# Patient Record
Sex: Male | Born: 1955 | Race: White | Hispanic: No | Marital: Single | Smoking: Current every day smoker
Health system: Southern US, Community
[De-identification: ages and names within clinical notes are randomized; demographics above are authoritative.]

## PROBLEM LIST (undated history)

## (undated) DIAGNOSIS — F32A Depression, unspecified: Secondary | ICD-10-CM

## (undated) DIAGNOSIS — J439 Emphysema, unspecified: Secondary | ICD-10-CM

## (undated) DIAGNOSIS — F329 Major depressive disorder, single episode, unspecified: Secondary | ICD-10-CM

## (undated) DIAGNOSIS — I639 Cerebral infarction, unspecified: Secondary | ICD-10-CM

## (undated) DIAGNOSIS — K5792 Diverticulitis of intestine, part unspecified, without perforation or abscess without bleeding: Secondary | ICD-10-CM

## (undated) DIAGNOSIS — I714 Abdominal aortic aneurysm, without rupture: Secondary | ICD-10-CM

## (undated) DIAGNOSIS — R569 Unspecified convulsions: Secondary | ICD-10-CM

## (undated) DIAGNOSIS — G4700 Insomnia, unspecified: Secondary | ICD-10-CM

## (undated) DIAGNOSIS — A6000 Herpesviral infection of urogenital system, unspecified: Secondary | ICD-10-CM

## (undated) HISTORY — PX: JOINT REPLACEMENT: SHX530

## (undated) HISTORY — DX: Depression, unspecified: F32.A

## (undated) HISTORY — DX: Emphysema, unspecified: J43.9

## (undated) HISTORY — DX: Major depressive disorder, single episode, unspecified: F32.9

## (undated) HISTORY — PX: BUNIONECTOMY: SHX129

## (undated) HISTORY — DX: Unspecified convulsions: R56.9

## (undated) HISTORY — DX: Cerebral infarction, unspecified: I63.9

## (undated) HISTORY — DX: Insomnia, unspecified: G47.00

## (undated) HISTORY — DX: Herpesviral infection of urogenital system, unspecified: A60.00

## (undated) HISTORY — DX: Abdominal aortic aneurysm, without rupture: I71.4

## (undated) HISTORY — DX: Diverticulitis of intestine, part unspecified, without perforation or abscess without bleeding: K57.92

## (undated) HISTORY — PX: OTHER SURGICAL HISTORY: SHX169

## (undated) HISTORY — PX: SMALL INTESTINE SURGERY: SHX150

---

## 1999-12-10 ENCOUNTER — Encounter: Payer: Self-pay | Admitting: Orthopedic Surgery

## 1999-12-10 ENCOUNTER — Encounter: Admission: RE | Admit: 1999-12-10 | Discharge: 1999-12-10 | Payer: Self-pay | Admitting: Orthopedic Surgery

## 2001-07-11 ENCOUNTER — Encounter: Payer: Self-pay | Admitting: Orthopedic Surgery

## 2001-07-11 ENCOUNTER — Encounter: Admission: RE | Admit: 2001-07-11 | Discharge: 2001-07-11 | Payer: Self-pay | Admitting: Orthopedic Surgery

## 2009-07-10 ENCOUNTER — Ambulatory Visit: Payer: Self-pay | Admitting: Diagnostic Radiology

## 2009-07-10 ENCOUNTER — Emergency Department (HOSPITAL_BASED_OUTPATIENT_CLINIC_OR_DEPARTMENT_OTHER): Admission: EM | Admit: 2009-07-10 | Discharge: 2009-07-10 | Payer: Self-pay | Admitting: Emergency Medicine

## 2009-07-20 ENCOUNTER — Inpatient Hospital Stay (HOSPITAL_COMMUNITY): Admission: RE | Admit: 2009-07-20 | Discharge: 2009-07-24 | Payer: Self-pay | Admitting: Orthopedic Surgery

## 2009-08-20 ENCOUNTER — Encounter (INDEPENDENT_AMBULATORY_CARE_PROVIDER_SITE_OTHER): Payer: Self-pay | Admitting: General Surgery

## 2009-08-20 ENCOUNTER — Inpatient Hospital Stay (HOSPITAL_COMMUNITY): Admission: EM | Admit: 2009-08-20 | Discharge: 2009-08-30 | Payer: Self-pay | Admitting: Emergency Medicine

## 2009-08-26 HISTORY — PX: COLOSTOMY: SHX63

## 2010-01-06 ENCOUNTER — Inpatient Hospital Stay (HOSPITAL_COMMUNITY): Admission: RE | Admit: 2010-01-06 | Discharge: 2010-01-09 | Payer: Self-pay | Admitting: Orthopedic Surgery

## 2010-01-06 ENCOUNTER — Encounter (INDEPENDENT_AMBULATORY_CARE_PROVIDER_SITE_OTHER): Payer: Self-pay | Admitting: Orthopedic Surgery

## 2010-03-26 HISTORY — PX: COLOSTOMY REVERSAL: SHX5782

## 2010-04-21 ENCOUNTER — Encounter: Admission: RE | Admit: 2010-04-21 | Discharge: 2010-04-21 | Payer: Self-pay | Admitting: General Surgery

## 2010-06-09 ENCOUNTER — Encounter (INDEPENDENT_AMBULATORY_CARE_PROVIDER_SITE_OTHER): Payer: Self-pay | Admitting: General Surgery

## 2010-06-09 ENCOUNTER — Inpatient Hospital Stay (HOSPITAL_COMMUNITY): Admission: RE | Admit: 2010-06-09 | Discharge: 2010-06-15 | Payer: Self-pay | Admitting: General Surgery

## 2010-12-16 IMAGING — CR DG KNEE COMPLETE 4+V*L*
4 series · 4 of 4 positions shown · non-contrast
Comparison: None

CLINICAL DATA: Left knee pain and swelling.

LEFT KNEE - COMPLETE 4+ VIEW

[t knee ap left]
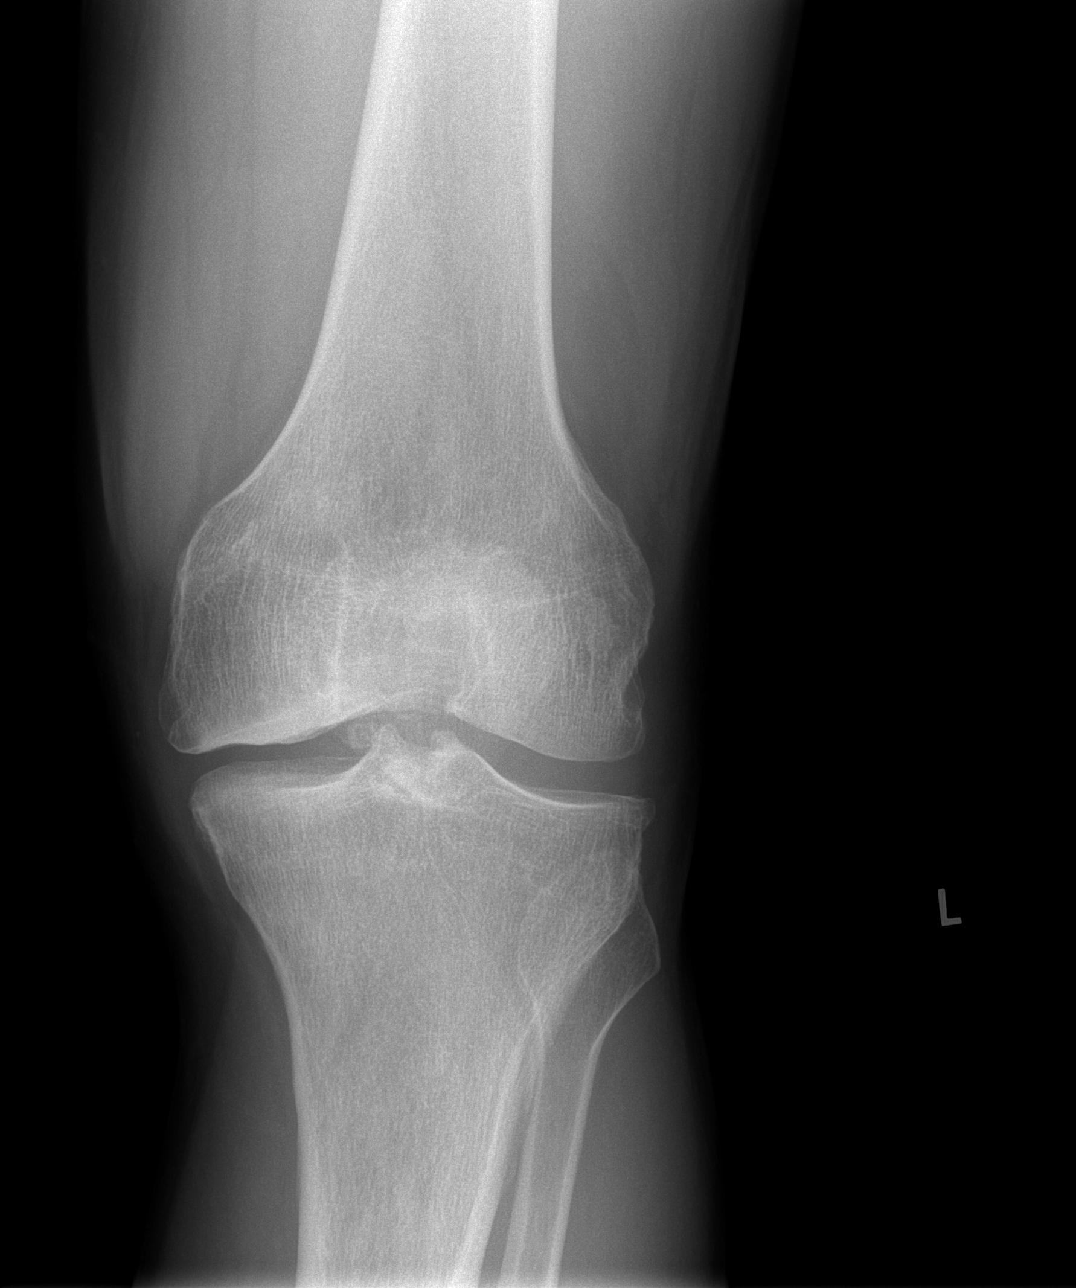

[t knee oblique left (1 of 2)]
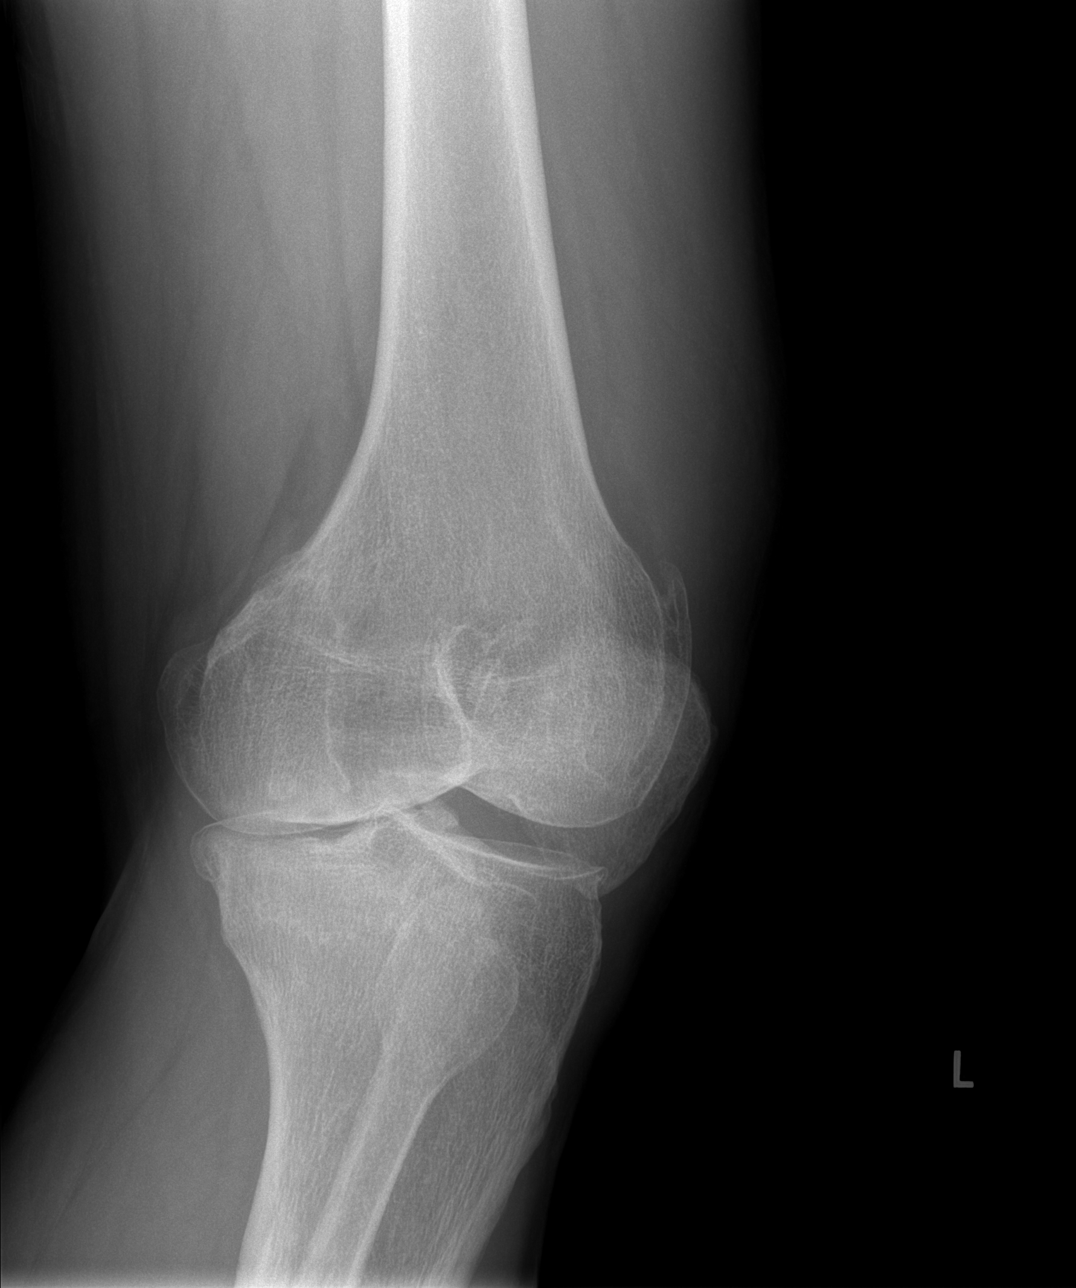

[t knee oblique left (2 of 2)]
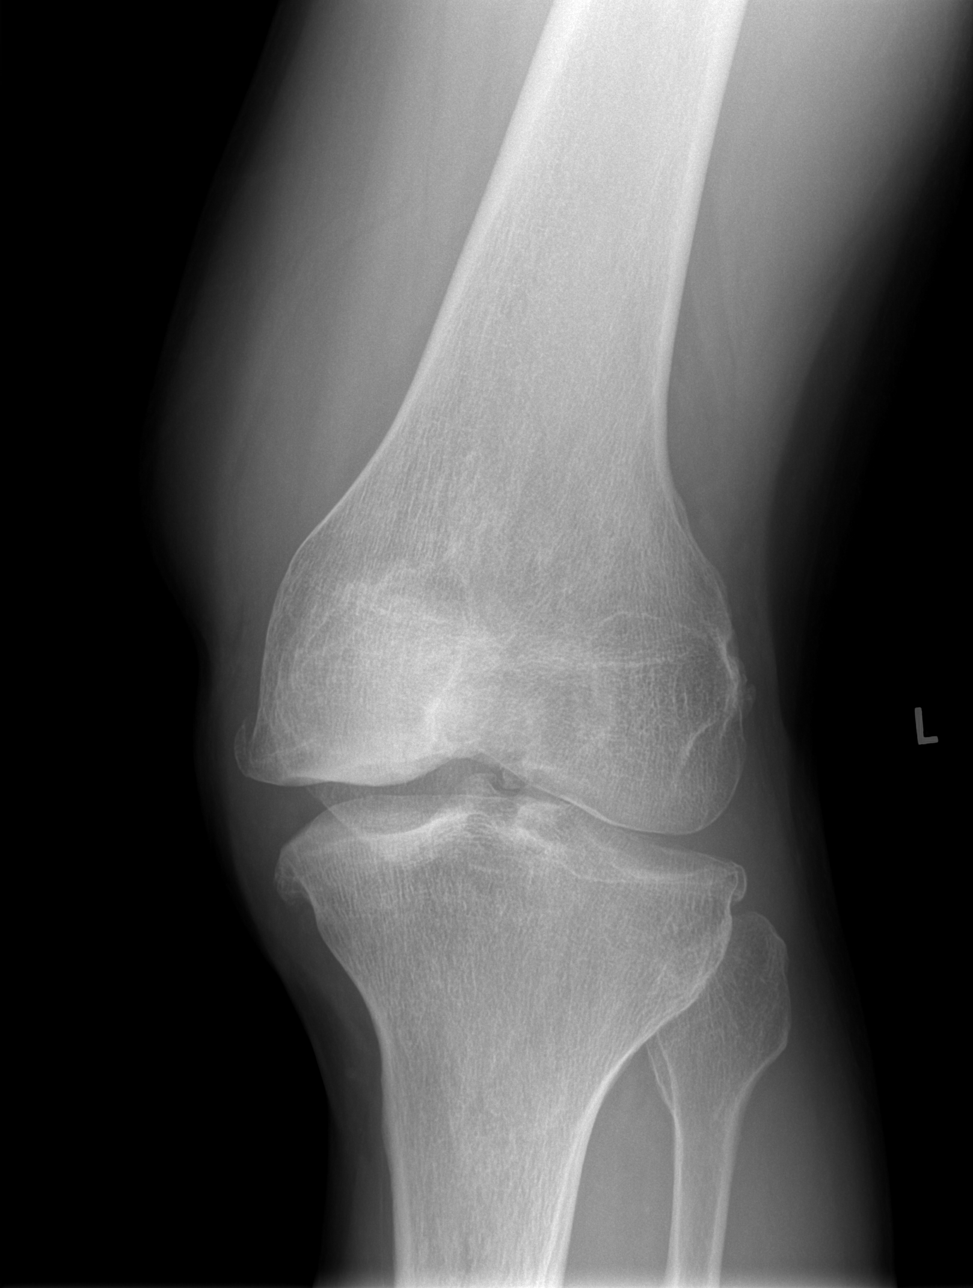

[t knee lat left]
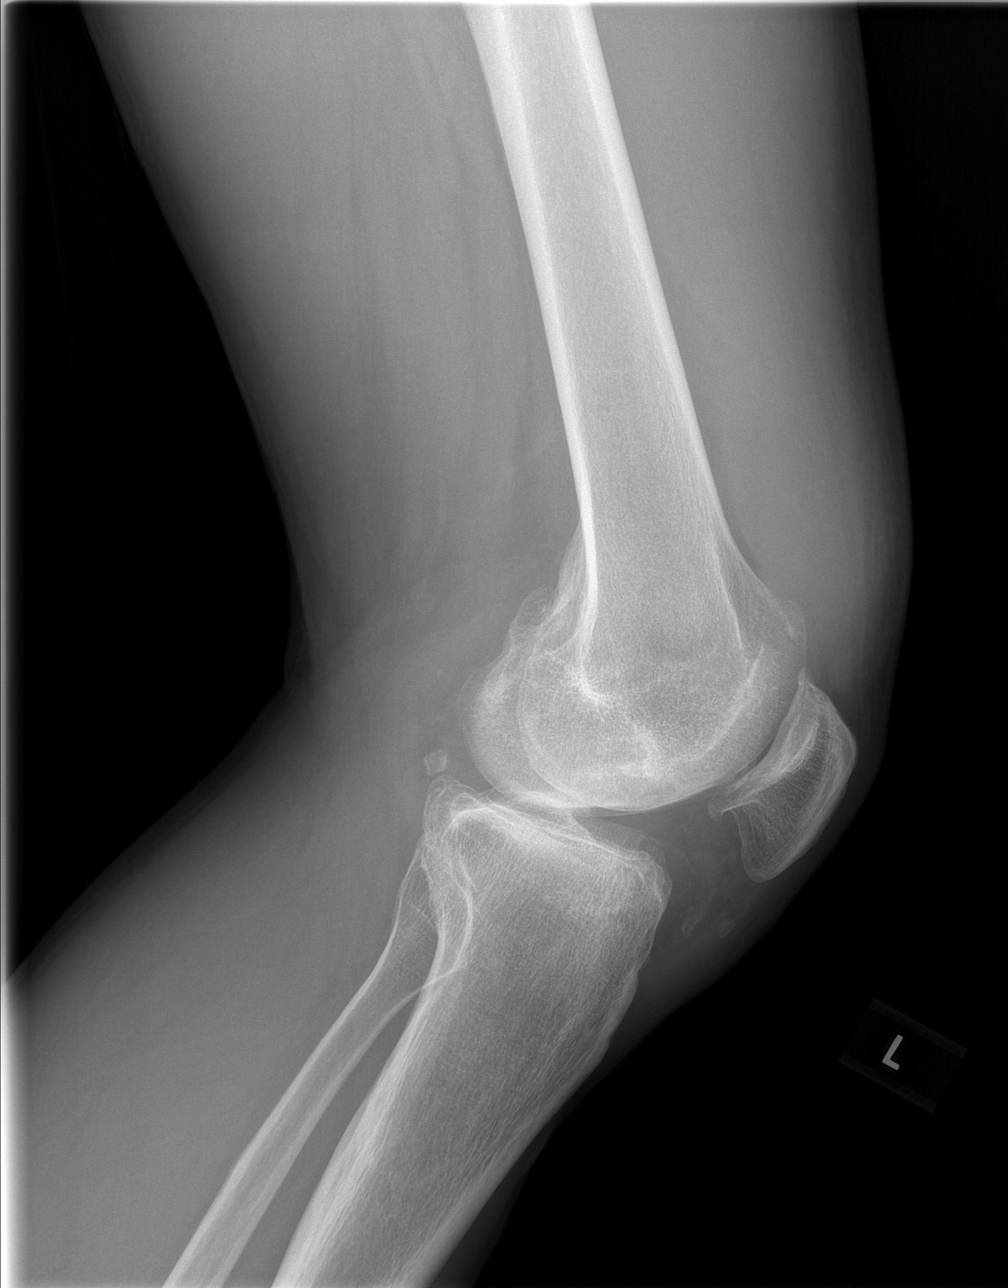

[4 of 4 positions shown; findings below may reference images not displayed]

FINDINGS: There are moderate tricompartmental degenerative changes
with joint space narrowing and osteophytic spurring.  There is a
large joint effusion.  Faint calcifications are noted in the region
of the patellar tendon and Hoffa's fat which may be due to prior
injury.
IMPRESSION: 1.  Moderate tricompartmental degenerative changes.
2.  No acute bony findings.
3.  Large joint effusion.

## 2011-01-09 LAB — COMPREHENSIVE METABOLIC PANEL
ALT: 12 U/L (ref 0–53)
Albumin: 4 g/dL (ref 3.5–5.2)
Alkaline Phosphatase: 85 U/L (ref 39–117)
BUN: 7 mg/dL (ref 6–23)
CO2: 29 mEq/L (ref 19–32)
Calcium: 9.5 mg/dL (ref 8.4–10.5)
Chloride: 102 mEq/L (ref 96–112)
Creatinine, Ser: 0.86 mg/dL (ref 0.4–1.5)
GFR calc non Af Amer: >60 mL/min (ref >60)
Sodium: 137 mEq/L (ref 135–145)
Total Bilirubin: 0.5 mg/dL (ref 0.3–1.2)

## 2011-01-09 LAB — CBC
HCT: 42.3 % (ref 39.0–52.0)
Hemoglobin: 14.2 g/dL (ref 13.0–17.0)
MCV: 94.8 fL (ref 78.0–100.0)
RBC: 5.15 MIL/uL (ref 4.22–5.81)
WBC: 18.2 10*3/uL — ABNORMAL HIGH (ref 4.0–10.5)

## 2011-01-09 LAB — DIFFERENTIAL
Basophils Absolute: 0 10*3/uL (ref 0.0–0.1)
Basophils Relative: 0 % (ref 0–1)
Lymphs Abs: 3.3 10*3/uL (ref 0.7–4.0)
Monocytes Relative: 7 % (ref 3–12)
Neutro Abs: 6.4 10*3/uL (ref 1.7–7.7)

## 2011-01-09 LAB — BASIC METABOLIC PANEL
BUN: 8 mg/dL (ref 6–23)
Calcium: 8.6 mg/dL (ref 8.4–10.5)
Chloride: 104 mEq/L (ref 96–112)
GFR calc non Af Amer: >60 mL/min (ref >60)
Glucose, Bld: 96 mg/dL (ref 70–99)
Sodium: 139 mEq/L (ref 135–145)

## 2011-01-09 LAB — URINALYSIS, ROUTINE W REFLEX MICROSCOPIC
Bilirubin Urine: NEGATIVE
Hgb urine dipstick: NEGATIVE
Nitrite: NEGATIVE
Urobilinogen, UA: 0.2 mg/dL (ref 0.0–1.0)

## 2011-01-09 LAB — SURGICAL PCR SCREEN
MRSA, PCR: NEGATIVE
Staphylococcus aureus: NEGATIVE

## 2011-01-19 LAB — BASIC METABOLIC PANEL
BUN: 3 mg/dL — ABNORMAL LOW (ref 6–23)
BUN: 7 mg/dL (ref 6–23)
Calcium: 8.8 mg/dL (ref 8.4–10.5)
Calcium: 9.2 mg/dL (ref 8.4–10.5)
Chloride: 103 mEq/L (ref 96–112)
Chloride: 97 mEq/L (ref 96–112)
Creatinine, Ser: 0.78 mg/dL (ref 0.4–1.5)
Creatinine, Ser: 0.81 mg/dL (ref 0.4–1.5)
Creatinine, Ser: 0.96 mg/dL (ref 0.4–1.5)
GFR calc non Af Amer: >60 mL/min (ref >60)
GFR calc non Af Amer: >60 mL/min (ref >60)
Potassium: 3.9 mEq/L (ref 3.5–5.1)
Sodium: 133 mEq/L — ABNORMAL LOW (ref 135–145)

## 2011-01-19 LAB — PROTIME-INR
INR: 1.43 (ref 0.00–1.49)
Prothrombin Time: 13.5 seconds (ref 11.6–15.2)
Prothrombin Time: 17.3 seconds — ABNORMAL HIGH (ref 11.6–15.2)
Prothrombin Time: 21.7 seconds — ABNORMAL HIGH (ref 11.6–15.2)

## 2011-01-19 LAB — URINALYSIS, ROUTINE W REFLEX MICROSCOPIC
Glucose, UA: NEGATIVE mg/dL
Ketones, ur: NEGATIVE mg/dL
Leukocytes, UA: NEGATIVE
Protein, ur: NEGATIVE mg/dL
Specific Gravity, Urine: 1.012 (ref 1.005–1.030)

## 2011-01-19 LAB — CBC
HCT: 40.9 % (ref 39.0–52.0)
Hemoglobin: 13.4 g/dL (ref 13.0–17.0)
Hemoglobin: 13.5 g/dL (ref 13.0–17.0)
MCHC: 32.8 g/dL (ref 30.0–36.0)
MCV: 97.5 fL (ref 78.0–100.0)
MCV: 97.6 fL (ref 78.0–100.0)
MCV: 98 fL (ref 78.0–100.0)
Platelets: 258 10*3/uL (ref 150–400)
Platelets: 263 10*3/uL (ref 150–400)
Platelets: 268 10*3/uL (ref 150–400)
Platelets: 320 10*3/uL (ref 150–400)
RBC: 4.2 MIL/uL — ABNORMAL LOW (ref 4.22–5.81)
RBC: 5.14 MIL/uL (ref 4.22–5.81)
RDW: 12.9 % (ref 11.5–15.5)
WBC: 14.9 10*3/uL — ABNORMAL HIGH (ref 4.0–10.5)

## 2011-01-19 LAB — ANAEROBIC CULTURE: Gram Stain: NONE SEEN

## 2011-01-19 LAB — TISSUE CULTURE

## 2011-01-19 LAB — TYPE AND SCREEN

## 2011-01-19 LAB — COMPREHENSIVE METABOLIC PANEL
AST: 19 U/L (ref 0–37)
Chloride: 102 mEq/L (ref 96–112)
GFR calc Af Amer: >60 mL/min (ref >60)
Total Bilirubin: 0.4 mg/dL (ref 0.3–1.2)
Total Protein: 7.9 g/dL (ref 6.0–8.3)

## 2011-01-19 LAB — GRAM STAIN

## 2011-01-26 IMAGING — CR DG CHEST 2V
2 series · 2 of 2 positions shown · non-contrast
Comparison: Portable chest x-ray 07/22/2009.  Visualized lung bases
on the CT abdomen and pelvis earlier today.

Addendum Begins

This addendum is given for the purpose of noting the patient has a
right PICC with the tip in the mid to lower superior vena cava.  No
pneumothorax.
Addendum Ends
CLINICAL DATA: Bowel perforation.  Preoperative respiratory
evaluation.
CHEST - 2 VIEW 08/20/2009:

[w chest lat]
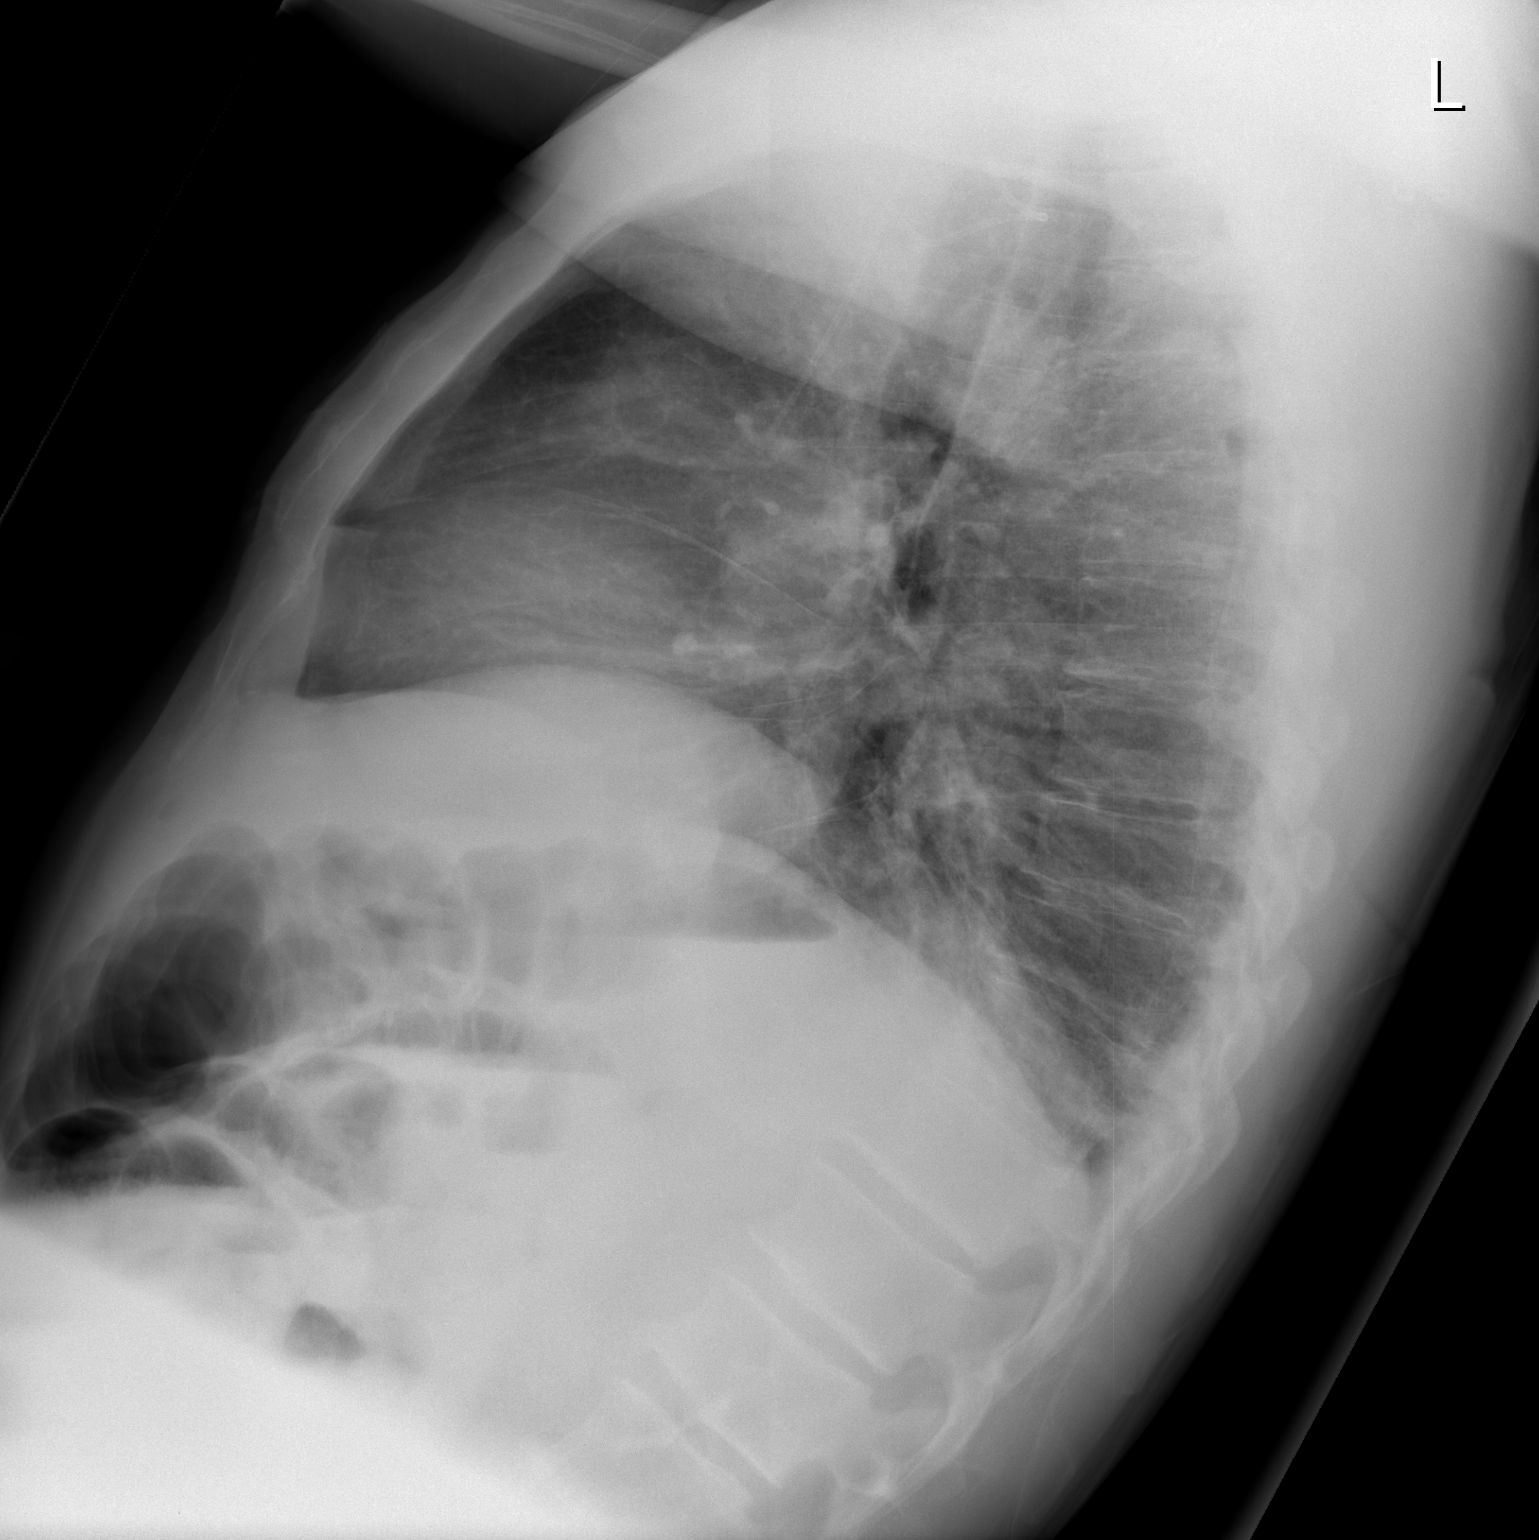

[view not recorded]
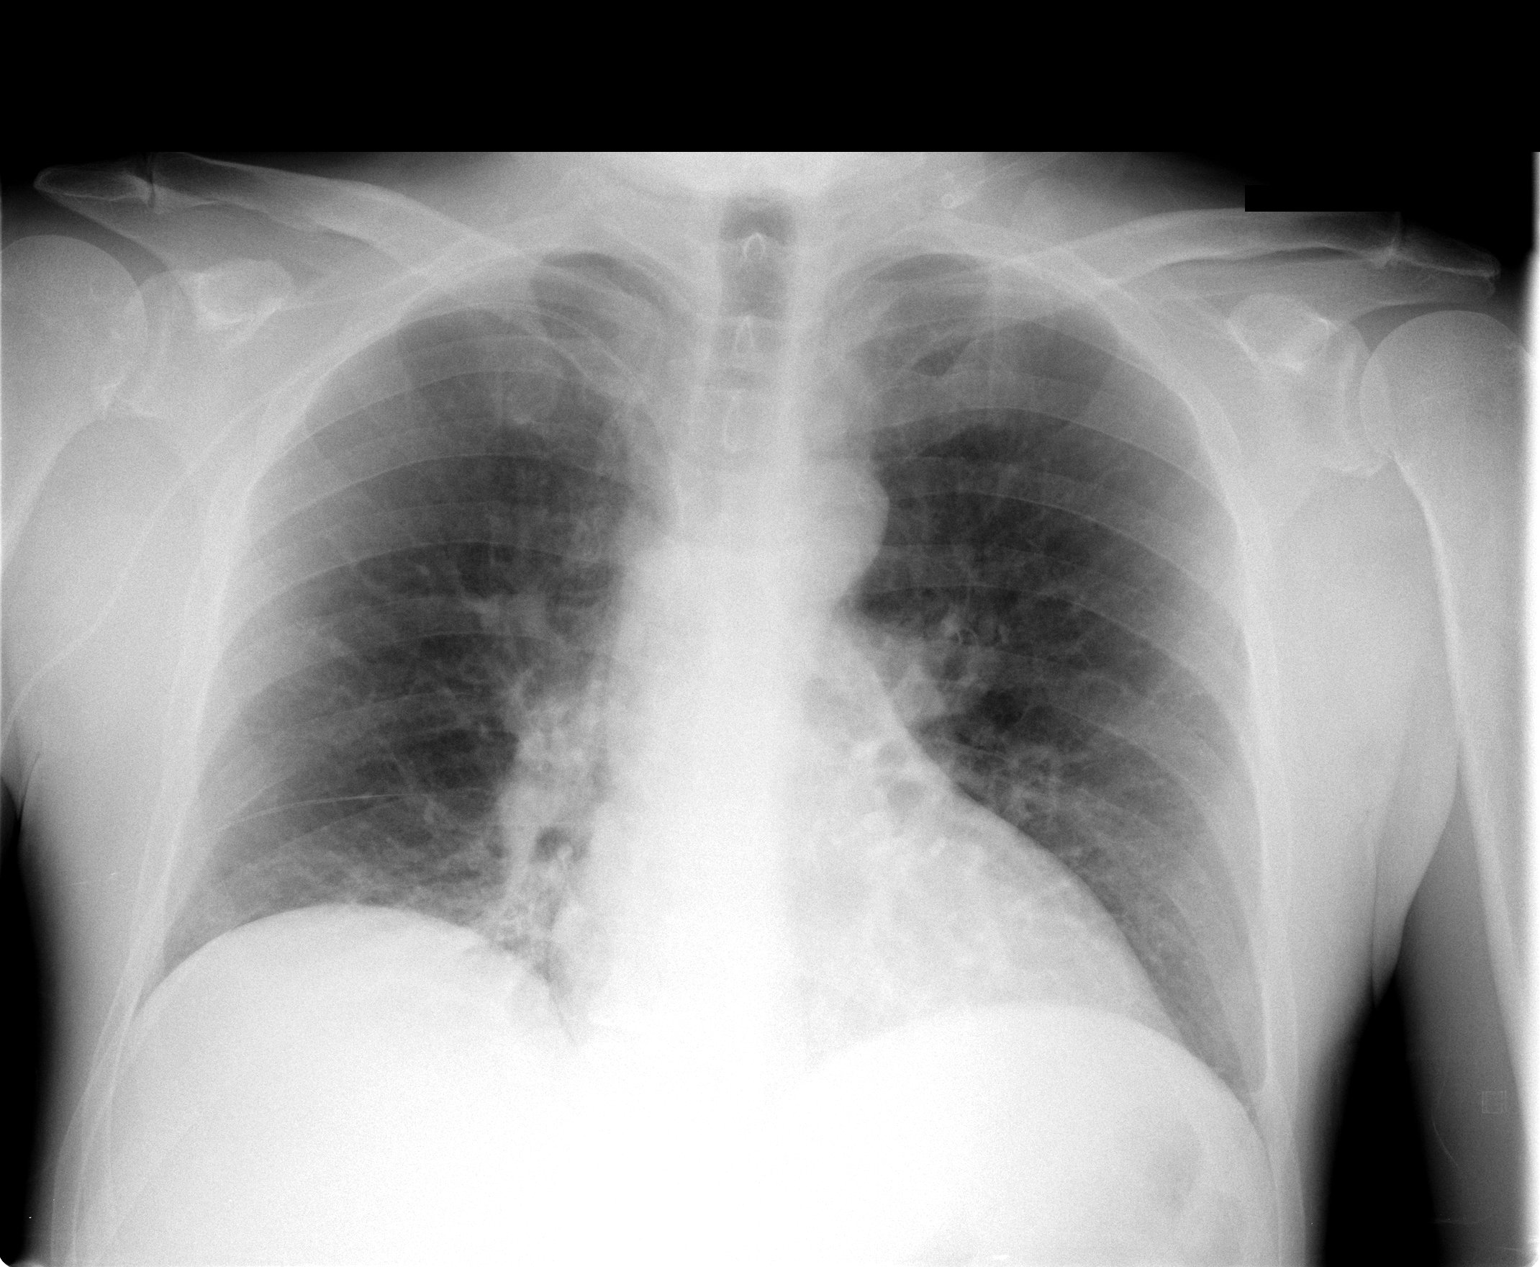

[2 of 2 positions shown; findings below may reference images not displayed]

FINDINGS: Interval development of atelectasis in the right lower
lobe since the CT earlier in the evening.  Heart size upper normal
for the AP technique.  Hilar and mediastinal contours unremarkable.
Lungs otherwise clear.  No pleural effusions.
IMPRESSION: Interval development of mild right lower lobe atelectasis since the
CT earlier in the evening.  No acute cardiopulmonary disease
otherwise.

## 2011-01-28 LAB — COMPREHENSIVE METABOLIC PANEL
ALT: 14 U/L (ref 0–53)
AST: 19 U/L (ref 0–37)
Albumin: 1.7 g/dL — ABNORMAL LOW (ref 3.5–5.2)
CO2: 31 mEq/L (ref 19–32)
Calcium: 8 mg/dL — ABNORMAL LOW (ref 8.4–10.5)
GFR calc Af Amer: >60 mL/min (ref >60)
GFR calc non Af Amer: >60 mL/min (ref >60)
Sodium: 151 mEq/L — ABNORMAL HIGH (ref 135–145)
Total Protein: 5.2 g/dL — ABNORMAL LOW (ref 6.0–8.3)

## 2011-01-28 LAB — BASIC METABOLIC PANEL
BUN: 11 mg/dL (ref 6–23)
BUN: 7 mg/dL (ref 6–23)
CO2: 31 mEq/L (ref 19–32)
CO2: 35 mEq/L — ABNORMAL HIGH (ref 19–32)
Chloride: 112 mEq/L (ref 96–112)
Chloride: 98 mEq/L (ref 96–112)
Creatinine, Ser: 0.67 mg/dL (ref 0.4–1.5)
Glucose, Bld: 148 mg/dL — ABNORMAL HIGH (ref 70–99)
Glucose, Bld: 91 mg/dL (ref 70–99)
Potassium: 3.5 mEq/L (ref 3.5–5.1)
Potassium: 3.7 mEq/L (ref 3.5–5.1)

## 2011-01-28 LAB — CBC
HCT: 25.2 % — ABNORMAL LOW (ref 39.0–52.0)
MCHC: 32.8 g/dL (ref 30.0–36.0)
MCHC: 34 g/dL (ref 30.0–36.0)
MCV: 95.4 fL (ref 78.0–100.0)
Platelets: 364 10*3/uL (ref 150–400)
Platelets: 551 10*3/uL — ABNORMAL HIGH (ref 150–400)
RBC: 2.64 MIL/uL — ABNORMAL LOW (ref 4.22–5.81)
WBC: 11 10*3/uL — ABNORMAL HIGH (ref 4.0–10.5)

## 2011-01-29 LAB — BASIC METABOLIC PANEL
BUN: 8 mg/dL (ref 6–23)
BUN: 9 mg/dL (ref 6–23)
CO2: 24 mEq/L (ref 19–32)
Chloride: 110 mEq/L (ref 96–112)
Creatinine, Ser: 0.72 mg/dL (ref 0.4–1.5)
Creatinine, Ser: 0.74 mg/dL (ref 0.4–1.5)
GFR calc Af Amer: >60 mL/min (ref >60)
GFR calc Af Amer: >60 mL/min (ref >60)
GFR calc non Af Amer: >60 mL/min (ref >60)
GFR calc non Af Amer: >60 mL/min (ref >60)
Glucose, Bld: 119 mg/dL — ABNORMAL HIGH (ref 70–99)
Potassium: 3.7 mEq/L (ref 3.5–5.1)
Potassium: 3.8 mEq/L (ref 3.5–5.1)
Potassium: 4 mEq/L (ref 3.5–5.1)

## 2011-01-29 LAB — TYPE AND SCREEN
ABO/RH(D): O POS
Antibody Screen: NEGATIVE

## 2011-01-29 LAB — DIFFERENTIAL
Basophils Absolute: 0.3 10*3/uL — ABNORMAL HIGH (ref 0.0–0.1)
Eosinophils Absolute: 0 10*3/uL (ref 0.0–0.7)
Lymphocytes Relative: 5 % — ABNORMAL LOW (ref 12–46)
Monocytes Relative: 2 % — ABNORMAL LOW (ref 3–12)
Neutrophils Relative %: 92 % — ABNORMAL HIGH (ref 43–77)

## 2011-01-29 LAB — URINALYSIS, ROUTINE W REFLEX MICROSCOPIC
Bilirubin Urine: NEGATIVE
Ketones, ur: NEGATIVE mg/dL
Leukocytes, UA: NEGATIVE
Nitrite: NEGATIVE
Protein, ur: 100 mg/dL — AB

## 2011-01-29 LAB — CBC
HCT: 27.5 % — ABNORMAL LOW (ref 39.0–52.0)
HCT: 27.8 % — ABNORMAL LOW (ref 39.0–52.0)
HCT: 30.2 % — ABNORMAL LOW (ref 39.0–52.0)
HCT: 30.6 % — ABNORMAL LOW (ref 39.0–52.0)
MCHC: 33.8 g/dL (ref 30.0–36.0)
MCV: 94.1 fL (ref 78.0–100.0)
MCV: 94.8 fL (ref 78.0–100.0)
Platelets: 502 10*3/uL — ABNORMAL HIGH (ref 150–400)
Platelets: 523 10*3/uL — ABNORMAL HIGH (ref 150–400)
Platelets: 557 10*3/uL — ABNORMAL HIGH (ref 150–400)
RBC: 2.86 MIL/uL — ABNORMAL LOW (ref 4.22–5.81)
RBC: 2.93 MIL/uL — ABNORMAL LOW (ref 4.22–5.81)
RBC: 3.21 MIL/uL — ABNORMAL LOW (ref 4.22–5.81)
RDW: 12.8 % (ref 11.5–15.5)
WBC: 13 10*3/uL — ABNORMAL HIGH (ref 4.0–10.5)
WBC: 18.4 10*3/uL — ABNORMAL HIGH (ref 4.0–10.5)
WBC: 26.1 10*3/uL — ABNORMAL HIGH (ref 4.0–10.5)

## 2011-01-29 LAB — ANAEROBIC CULTURE

## 2011-01-29 LAB — COMPREHENSIVE METABOLIC PANEL
AST: 14 U/L (ref 0–37)
BUN: 13 mg/dL (ref 6–23)
CO2: 24 mEq/L (ref 19–32)
Calcium: 8.5 mg/dL (ref 8.4–10.5)
Chloride: 95 mEq/L — ABNORMAL LOW (ref 96–112)
Creatinine, Ser: 0.95 mg/dL (ref 0.4–1.5)
GFR calc Af Amer: >60 mL/min (ref >60)
GFR calc non Af Amer: >60 mL/min (ref >60)
Total Bilirubin: 0.9 mg/dL (ref 0.3–1.2)

## 2011-01-29 LAB — BODY FLUID CULTURE

## 2011-01-29 LAB — ABO/RH: ABO/RH(D): O POS

## 2011-01-30 LAB — BASIC METABOLIC PANEL
BUN: 12 mg/dL (ref 6–23)
BUN: 14 mg/dL (ref 6–23)
CO2: 29 mEq/L (ref 19–32)
Calcium: 8.4 mg/dL (ref 8.4–10.5)
Calcium: 9.2 mg/dL (ref 8.4–10.5)
Chloride: 100 mEq/L (ref 96–112)
Creatinine, Ser: 0.7 mg/dL (ref 0.4–1.5)
Creatinine, Ser: 0.9 mg/dL (ref 0.4–1.5)
GFR calc Af Amer: >60 mL/min (ref >60)
GFR calc non Af Amer: >60 mL/min (ref >60)
Glucose, Bld: 102 mg/dL — ABNORMAL HIGH (ref 70–99)
Sodium: 133 mEq/L — ABNORMAL LOW (ref 135–145)

## 2011-01-30 LAB — COMPREHENSIVE METABOLIC PANEL
ALT: 24 U/L (ref 0–53)
Alkaline Phosphatase: 73 U/L (ref 39–117)
BUN: 16 mg/dL (ref 6–23)
CO2: 28 mEq/L (ref 19–32)
Calcium: 9.5 mg/dL (ref 8.4–10.5)
GFR calc non Af Amer: >60 mL/min (ref >60)
Glucose, Bld: 106 mg/dL — ABNORMAL HIGH (ref 70–99)
Total Protein: 7.9 g/dL (ref 6.0–8.3)

## 2011-01-30 LAB — URINALYSIS, ROUTINE W REFLEX MICROSCOPIC
Glucose, UA: NEGATIVE mg/dL
Hgb urine dipstick: NEGATIVE
Specific Gravity, Urine: 1.025 (ref 1.005–1.030)
pH: 6.5 (ref 5.0–8.0)

## 2011-01-30 LAB — CBC
HCT: 35.2 % — ABNORMAL LOW (ref 39.0–52.0)
HCT: 37.2 % — ABNORMAL LOW (ref 39.0–52.0)
HCT: 43.5 % (ref 39.0–52.0)
Hemoglobin: 12.6 g/dL — ABNORMAL LOW (ref 13.0–17.0)
Hemoglobin: 12.6 g/dL — ABNORMAL LOW (ref 13.0–17.0)
Hemoglobin: 13 g/dL (ref 13.0–17.0)
Hemoglobin: 14.7 g/dL (ref 13.0–17.0)
MCHC: 33.7 g/dL (ref 30.0–36.0)
MCHC: 33.9 g/dL (ref 30.0–36.0)
MCHC: 33.9 g/dL (ref 30.0–36.0)
MCHC: 34 g/dL (ref 30.0–36.0)
MCHC: 34.5 g/dL (ref 30.0–36.0)
MCHC: 33.3 g/dL (ref 30.0–36.0)
MCV: 98.1 fL (ref 78.0–100.0)
MCV: 98.4 fL (ref 78.0–100.0)
MCV: 98.5 fL (ref 78.0–100.0)
Platelets: 580 10*3/uL — ABNORMAL HIGH (ref 150–400)
Platelets: 610 10*3/uL — ABNORMAL HIGH (ref 150–400)
Platelets: 306 10*3/uL (ref 150–400)
RBC: 3.78 MIL/uL — ABNORMAL LOW (ref 4.22–5.81)
RBC: 3.92 MIL/uL — ABNORMAL LOW (ref 4.22–5.81)
RBC: 4.42 MIL/uL (ref 4.22–5.81)
RDW: 13.1 % (ref 11.5–15.5)
RDW: 13.1 % (ref 11.5–15.5)
WBC: 16.5 10*3/uL — ABNORMAL HIGH (ref 4.0–10.5)
WBC: 19.1 10*3/uL — ABNORMAL HIGH (ref 4.0–10.5)
WBC: 19.3 10*3/uL — ABNORMAL HIGH (ref 4.0–10.5)
WBC: 16.3 10*3/uL — ABNORMAL HIGH (ref 4.0–10.5)

## 2011-01-30 LAB — BODY FLUID CULTURE
Culture: NO GROWTH
Gram Stain: NONE SEEN

## 2011-01-30 LAB — DIFFERENTIAL
Basophils Relative: 4 % — ABNORMAL HIGH (ref 0–1)
Eosinophils Absolute: 0.3 10*3/uL (ref 0.0–0.7)
Lymphs Abs: 2 10*3/uL (ref 0.7–4.0)
Neutro Abs: 12.5 10*3/uL — ABNORMAL HIGH (ref 1.7–7.7)
Neutrophils Relative %: 76 % (ref 43–77)

## 2011-01-30 LAB — SYNOVIAL CELL COUNT + DIFF, W/ CRYSTALS: Crystals, Fluid: NONE SEEN

## 2011-01-30 LAB — ANAEROBIC CULTURE

## 2011-01-30 LAB — PROTIME-INR
INR: 1 (ref 0.00–1.49)
Prothrombin Time: 13.4 seconds (ref 11.6–15.2)

## 2012-06-23 ENCOUNTER — Other Ambulatory Visit: Payer: Self-pay | Admitting: Nurse Practitioner

## 2012-06-26 DIAGNOSIS — I714 Abdominal aortic aneurysm, without rupture: Secondary | ICD-10-CM

## 2012-06-26 HISTORY — DX: Abdominal aortic aneurysm, without rupture: I71.4

## 2012-06-28 ENCOUNTER — Ambulatory Visit
Admission: RE | Admit: 2012-06-28 | Discharge: 2012-06-28 | Disposition: A | Discharge status: Home / Self Care | Payer: BC Managed Care – PPO | Source: Ambulatory Visit | Attending: Nurse Practitioner | Admitting: Nurse Practitioner

## 2012-06-28 MED ORDER — IOHEXOL 300 MG/ML  SOLN
100.0000 mL | Freq: Once | INTRAMUSCULAR | Status: AC | PRN
  Administered 2012-06-28: 100 mL via INTRAVENOUS

## 2012-10-11 ENCOUNTER — Other Ambulatory Visit: Payer: Self-pay | Admitting: Family Medicine

## 2012-10-11 ENCOUNTER — Ambulatory Visit
Admission: RE | Admit: 2012-10-11 | Discharge: 2012-10-11 | Disposition: A | Discharge status: Home / Self Care | Payer: BC Managed Care – PPO | Source: Ambulatory Visit | Attending: Family Medicine | Admitting: Family Medicine

## 2012-10-11 DIAGNOSIS — R509 Fever, unspecified: Secondary | ICD-10-CM

## 2012-10-11 DIAGNOSIS — R05 Cough: Secondary | ICD-10-CM

## 2013-02-13 ENCOUNTER — Ambulatory Visit (INDEPENDENT_AMBULATORY_CARE_PROVIDER_SITE_OTHER): Payer: BC Managed Care – PPO | Admitting: Family Medicine

## 2013-02-13 VITALS — BP 150/80 | HR 67 | Temp 98.2°F | Resp 16 | Ht 69.5 in | Wt 167.0 lb

## 2013-02-13 DIAGNOSIS — I714 Abdominal aortic aneurysm, without rupture: Secondary | ICD-10-CM

## 2013-02-13 DIAGNOSIS — M79609 Pain in unspecified limb: Secondary | ICD-10-CM

## 2013-02-13 DIAGNOSIS — M25572 Pain in left ankle and joints of left foot: Secondary | ICD-10-CM

## 2013-02-13 DIAGNOSIS — R21 Rash and other nonspecific skin eruption: Secondary | ICD-10-CM

## 2013-02-13 DIAGNOSIS — M25579 Pain in unspecified ankle and joints of unspecified foot: Secondary | ICD-10-CM

## 2013-02-13 LAB — RPR

## 2013-02-13 MED ORDER — VALACYCLOVIR HCL 1 G PO TABS: 1000.0000 mg | ORAL_TABLET | Freq: Two times a day (BID) | ORAL | Status: DC

## 2013-02-13 NOTE — Progress Notes (Signed)
57 yo man with three problems:  1. Small abdominal aortic aneurysm 3 cm on CT (for evaluation of left abdominal wall nodule)  2. Left foot nodule just proximal to the great toe MTP joint on plantar surface, tender, x >1 month  3. New blister on penis.  Same partner x February.  She will get checked today by her doctor.  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  Assessment: Small abdominal aortic aneurysm-patient will followup with his PCP for annual ultrasounds Nodule in foot most consistent with ganglion cyst-referred to Dr. Charlsie Merles Penile rash most consistent with HSV-check for other STDs with URiprobe, check HSV and HSV 1+ 2 blood tests

## 2013-02-14 LAB — GC/CHLAMYDIA PROBE AMP
CT Probe RNA: NEGATIVE
GC Probe RNA: NEGATIVE

## 2013-02-14 LAB — HSV(HERPES SIMPLEX VRS) I + II AB-IGG
HSV 1 Glycoprotein G Ab, IgG: 1.27 IV — ABNORMAL HIGH
HSV 2 Glycoprotein G Ab, IgG: 8.24 IV — ABNORMAL HIGH

## 2013-02-15 LAB — HERPES SIMPLEX VIRUS CULTURE: Organism ID, Bacteria: NOT DETECTED

## 2013-02-16 ENCOUNTER — Other Ambulatory Visit: Payer: Self-pay | Admitting: Family Medicine

## 2013-02-16 DIAGNOSIS — B009 Herpesviral infection, unspecified: Secondary | ICD-10-CM

## 2013-02-16 MED ORDER — VALACYCLOVIR HCL 500 MG PO TABS: 500.0000 mg | ORAL_TABLET | Freq: Two times a day (BID) | ORAL | Status: DC

## 2013-08-30 ENCOUNTER — Telehealth: Payer: Self-pay | Admitting: *Deleted

## 2013-08-30 NOTE — Telephone Encounter (Signed)
Pt scheduled surgery with Dr Charlsie Merles for 09/19/2013.  I informed pt he would need to be scheduled in office for a consultation to discuss the surgery and care.  Shanda Bumps will call pt 08/31/2013 and scheduled for consultation

## 2013-09-07 ENCOUNTER — Ambulatory Visit (INDEPENDENT_AMBULATORY_CARE_PROVIDER_SITE_OTHER): Payer: BC Managed Care – PPO | Admitting: Podiatry

## 2013-09-07 ENCOUNTER — Encounter: Payer: Self-pay | Admitting: Podiatry

## 2013-09-07 VITALS — BP 113/88 | HR 60 | Resp 16 | Ht 70.0 in | Wt 170.0 lb

## 2013-09-07 DIAGNOSIS — M2010 Hallux valgus (acquired), unspecified foot: Secondary | ICD-10-CM

## 2013-09-07 DIAGNOSIS — D492 Neoplasm of unspecified behavior of bone, soft tissue, and skin: Secondary | ICD-10-CM

## 2013-09-07 DIAGNOSIS — B351 Tinea unguium: Secondary | ICD-10-CM

## 2013-09-07 NOTE — Progress Notes (Signed)
Subjective:     Patient ID: Jason Parsons, male   DOB: 25-Nov-1955, 57 y.o.   MRN: [id number]  HPI patient states I am ready to get this left foot fixed and also I would like to have this nodule removed from my left arch. States they both get tender and make shoe gear difficult   Review of Systems  All other systems reviewed and are negative.       Objective:   Physical Exam  Constitutional: He is oriented to person, place, and time.  Cardiovascular: Intact distal pulses.   Musculoskeletal: Normal range of motion.  Neurological: He is oriented to person, place, and time.  Skin: Skin is warm.   patient is found to have structural bunion deformity left with redness on the side and a small nodule within the left arch plantar fascia in the distal portion. 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 Patient wants surgery and at this time I allowed him to read consent form for correction of the left foot consisting of Austin bunionectomy with pin and removal of probable plantar fibroma. Understands all complications as outlined on consent form and that total recovery. We'll take 6 months to one year with no long-term guarantees. Preoperative instructions given an air fracture walker dispensed with instructions on getting used to it prior to surgery

## 2013-09-07 NOTE — Patient Instructions (Addendum)
Bunionectomy A bunionectomy is surgery to remove a bunion. A bunion is an enlargement of the joint at the base of the big toe. It is made up of bone and soft tissue on the inside part of the joint. Over time, a painful lump appears on the inside of the joint. The big toe begins to point inward toward the second toe. New bone growth can occur and a bone spur may form. The pain eventually causes difficulty walking. A bunion usually results from inflammation caused by the irritation of poorly fitting shoes. It often begins later in life. A bunionectomy is performed when nonsurgical treatment no longer works. When surgery is needed, the extent of the procedure will depend on the degree of deformity of the foot. Your surgeon will discuss with you the different procedures and what will work best for you depending on your age and health. LET YOUR CAREGIVER KNOW ABOUT:   Previous problems with anesthetics or medicines used to numb the skin.  Allergies to dyes, iodine, foods, and/or latex.  Medicines taken including herbs, eye drops, prescription medicines (especially medicines used to "thin the blood"), aspirin and other over-the-counter medicines, and steroids (by mouth or as a cream).  History of bleeding or blood problems.  Possibility of pregnancy, if this applies.  History of blood clots in your legs and/or lungs .  Previous surgery.  Other important health problems. RISKS AND COMPLICATIONS   Infection.  Pain.  Nerve damage.  Possibility that the bunion will recur. BEFORE THE PROCEDURE  You should be present 60 minutes prior to your procedure or as directed.  PROCEDURE  Surgery is often done so that you can go home the same day (outpatient). It may be done in a hospital or in an outpatient surgical center. An anesthetic will be used to help you sleep during the procedure. Sometimes, a spinal anesthetic is used to make you numb below the waist. A cut (incision) is made over the swollen  area at the first joint of the big toe. The enlarged lump will be removed. If there is a need to reposition the bones of the big toe, this may require more than 1 incision. The bone itself may need to be cut. Screws and wires may be used in the repair. These can be removed at a later date. In severe cases, the entire joint may need to be removed and a joint replacement inserted. When done, the incision is closed with stitches (sutures). Skin adhesive strips may be added for reinforcement. They help hold the incision closed.  AFTER THE PROCEDURE  Compression bandages (dressings) are then wrapped around the wound. This helps to keep the foot in alignment and reduce swelling. Your foot will be monitored for bleeding and swelling. You will need to stay for a few hours in the recovery area before being discharged. This allows time for the anesthesia to wear off. You will be discharged home when you are awake, stable, and doing well. HOME CARE INSTRUCTIONS   You can expect to return to normal activities within 6 to 8 weeks after surgery. The foot is at increased risk for swelling for several months. When you can expect to bear weight on the operated foot will depend on the extent of your surgery. The milder the deformity, the less tissue is removed and the sooner the return to normal activity level. During the recovery period, a special shoe, boot, or cast may be worn to accommodate the surgical bandage and to help provide stability   to the foot.  Once you are home, an ice pack applied to the operative site may help with discomfort and keep swelling down. Stop using the ice if it causes discomfort.  Keep your feet raised (elevated) when possible to lessen swelling.  If you have an elastic bandage on your foot and you have numbness, tingling, or your foot becomes cold and blue, adjust the bandage to make it comfortable.  Change dressings as directed.  Keep the wound dry and clean. The wound may be washed  gently with soap and water. Gently blot dry without rubbing. Do not take baths or use swimming pools or hot tubs for 10 days, or as instructed by your caregiver.  Only take over-the-counter or prescription medicines for pain, discomfort, or fever as directed by your caregiver.  You may continue a normal diet as directed.  For activity, use crutches with no weight bearing or your orthopedic shoe as directed. Continue to use crutches or a cane as directed until you can stand without causing pain. SEEK MEDICAL CARE IF:   You have redness, swelling, bruising, or increasing pain in the wound.  There is pus coming from the wound.  You have drainage from a wound lasting longer than 1 day.  You have an oral temperature above 102 F (38.9 C).  You notice a bad smell coming from the wound or dressing.  The wound breaks open after sutures have been removed.  You develop dizzy episodes or fainting while standing.  You have persistent nausea or vomiting.  Your toes become cold.  Pain is not relieved with medicines. SEEK IMMEDIATE MEDICAL CARE IF:   You develop a rash.  You have difficulty breathing.  You develop any reaction or side effects to medicines given.  Your toes are numb or blue, or you have severe pain. MAKE SURE YOU:   Understand these instructions.  Will watch your condition.  Will get help right away if you are not doing well or get worse. Document Released: 09/25/2005 Document Revised: 01/04/2012 Document Reviewed: 10/31/2007 [facility] Patient Information 2014 [place], [place].    Pre-Operative Instructions  Congratulations, you have decided to take an important step to improving your quality of life.  You can be assured that the doctors of [facility] will be with you every step of the way.  1. Plan to be at the surgery center/hospital at least 1 (one) hour prior to your scheduled time unless otherwise directed by the surgical center/hospital staff.  You  must have a responsible adult accompany you, remain during the surgery and drive you home.  Make sure you have directions to the surgical center/hospital and know how to get there on time. 2. For hospital based surgery you will need to obtain a history and physical form from your family physician within 1 month prior to the date of surgery- we will give you a form for you primary physician.  3. We make every effort to accommodate the date you request for surgery.  There are however, times where surgery dates or times have to be moved.  We will contact you as soon as possible if a change in schedule is required.   4. No Aspirin/Ibuprofen for one week before surgery.  If you are on aspirin, any non-steroidal anti-inflammatory medications (Mobic, Aleve, Ibuprofen) you should stop taking it 7 days prior to your surgery.  You make take Tylenol  For pain prior to surgery.  5. Medications- If you are taking daily heart and blood  pressure medications, seizure, reflux, allergy, asthma, anxiety, pain or diabetes medications, make sure the surgery center/hospital is aware before the day of surgery so they may notify you which medications to take or avoid the day of surgery. 6. No food or drink after midnight the night before surgery unless directed otherwise by surgical center/hospital staff. 7. No alcoholic beverages 24 hours prior to surgery.  No smoking 24 hours prior to or 24 hours after surgery. 8. Wear loose pants or shorts- loose enough to fit over bandages, boots, and casts. 9. No slip on shoes, sneakers are best. 10. Bring your boot with you to the surgery center/hospital.  Also bring crutches or a walker if your physician has prescribed it for you.  If you do not have this equipment, it will be provided for you after surgery. 11. If you have not been contracted by the surgery center/hospital by the day before your surgery, call to confirm the date and time of your surgery. 12. Leave-time from work may vary  depending on the type of surgery you have.  Appropriate arrangements should be made prior to surgery with your employer. 13. Prescriptions will be provided immediately following surgery by your doctor.  Have these filled as soon as possible after surgery and take the medication as directed. 14. Remove nail polish on the operative foot. 15. Wash the night before surgery.  The night before surgery wash the foot and leg well with the antibacterial soap provided and water paying special attention to beneath the toenails and in between the toes.  Rinse thoroughly with water and dry well with a towel.  Perform this wash unless told not to do so by your physician.  Enclosed: 1 Ice pack (please put in freezer the night before surgery)   1 Hibiclens skin cleaner   Pre-op Instructions  If you have any questions regarding the instructions, do not hesitate to call our office.  [place]: [address] [place], [place] [address] [phone]  [place]: [address], [place], [place] [address] [phone]  [place]: [address], [place] [address] [phone]  Dr. Santiago Bumpers DPM, Dr. Cristie Hem DPM Dr. Alvan Dame DPM, Dr. Ardelle Anton DPM, Dr. Marlowe Aschoff DPM

## 2013-09-19 ENCOUNTER — Encounter: Payer: Self-pay | Admitting: Podiatry

## 2013-09-19 DIAGNOSIS — D492 Neoplasm of unspecified behavior of bone, soft tissue, and skin: Secondary | ICD-10-CM

## 2013-09-19 DIAGNOSIS — M2010 Hallux valgus (acquired), unspecified foot: Secondary | ICD-10-CM

## 2013-09-25 ENCOUNTER — Ambulatory Visit (INDEPENDENT_AMBULATORY_CARE_PROVIDER_SITE_OTHER): Payer: BC Managed Care – PPO

## 2013-09-25 ENCOUNTER — Ambulatory Visit (INDEPENDENT_AMBULATORY_CARE_PROVIDER_SITE_OTHER): Payer: BC Managed Care – PPO | Admitting: Podiatry

## 2013-09-25 ENCOUNTER — Encounter: Payer: Self-pay | Admitting: Podiatry

## 2013-09-25 VITALS — BP 120/70 | HR 101 | Resp 12

## 2013-09-25 DIAGNOSIS — Z9889 Other specified postprocedural states: Secondary | ICD-10-CM

## 2013-09-25 DIAGNOSIS — M2010 Hallux valgus (acquired), unspecified foot: Secondary | ICD-10-CM

## 2013-09-25 NOTE — Progress Notes (Signed)
Subjective:     Patient ID: Jason Parsons, male   DOB: [birth year], 57 y.o.   MRN: [id number]  HPI patient states that he is feeling good and is having minimal discomfort 6 days after surgery   Review of Systems     Objective:   Physical Exam Neurovascular status intact with negative Homans sign. Incision first metatarsal and plantar arch are healing well with minimal edema and stitches intact with good range of motion first MPJ 30 dorsiflexion 25 of plantarflexion    Assessment:     Healing well postoperative left foot surgery    Plan:     X-rays reviewed with patient and sterile dressing reapplied. Reappoint 2 weeks for suture removal and continue immobilization in told that time

## 2013-10-09 ENCOUNTER — Ambulatory Visit (INDEPENDENT_AMBULATORY_CARE_PROVIDER_SITE_OTHER): Payer: BC Managed Care – PPO | Admitting: Podiatry

## 2013-10-09 ENCOUNTER — Encounter: Payer: Self-pay | Admitting: Podiatry

## 2013-10-09 VITALS — BP 135/87 | HR 83 | Resp 12

## 2013-10-09 DIAGNOSIS — M2022 Hallux rigidus, left foot: Secondary | ICD-10-CM

## 2013-10-09 DIAGNOSIS — M2020 Hallux rigidus, unspecified foot: Secondary | ICD-10-CM

## 2013-10-09 NOTE — Progress Notes (Signed)
Subjective:     Patient ID: Jason Parsons, male   DOB: Apr 30, 1956, 57 y.o.   MRN: [id number]  HPI patient states I'm doing great and the incision on the bottom of my foot is continuing to improve and I big toe joint feels wonderful   Review of Systems     Objective:   Physical Exam 3 weeks after foot surgery left with good structural alignment range of motion of the first MPJ and well coapted wound site plantar left foot    Assessment:     Recovering well from surgery of 3 weeks ago    Plan:     Stitches are removed with wound edges coapted well and instructions on gradual return to saw shoe gear over the next 2 weeks given to patient. Reappoint to recheck

## 2013-10-26 DIAGNOSIS — I639 Cerebral infarction, unspecified: Secondary | ICD-10-CM

## 2013-10-26 HISTORY — DX: Cerebral infarction, unspecified: I63.9

## 2013-11-06 ENCOUNTER — Encounter: Payer: BC Managed Care – PPO | Admitting: Podiatry

## 2013-11-13 ENCOUNTER — Encounter: Payer: Self-pay | Admitting: Podiatry

## 2013-11-13 ENCOUNTER — Ambulatory Visit (INDEPENDENT_AMBULATORY_CARE_PROVIDER_SITE_OTHER): Payer: BC Managed Care – PPO

## 2013-11-13 ENCOUNTER — Ambulatory Visit (INDEPENDENT_AMBULATORY_CARE_PROVIDER_SITE_OTHER): Payer: BC Managed Care – PPO | Admitting: Podiatry

## 2013-11-13 VITALS — BP 156/110 | HR 108 | Resp 16

## 2013-11-13 DIAGNOSIS — Z472 Encounter for removal of internal fixation device: Secondary | ICD-10-CM

## 2013-11-13 DIAGNOSIS — Z9889 Other specified postprocedural states: Secondary | ICD-10-CM

## 2013-11-13 NOTE — Progress Notes (Signed)
1) Austin bunionectomy left foot 2) Plantar fibroma left foot

## 2013-11-13 NOTE — Progress Notes (Signed)
Subjective:     Patient ID: Jason Parsons, male   DOB: 1956-03-28, 58 y.o.   MRN: [id number]  HPI patient states I'm doing fine but I feel like the pin might be a little bit prominent on top of the first metatarsal. 8 weeks after foot surgery left   Review of Systems     Objective:   Physical Exam Neurovascular status intact with well-healed first MPJ left and prominent pin position on the first metatarsal shaft that moderately painful when pressed    Assessment:     Well-healing dorsal and plantar incision site left with prominent pin position    Plan:     H&P and x-ray reviewed. Recommended removal of pin and allow patient to read consent form concerning correction and pin removal. Explained all risks associated with this procedure and patient is scheduled for pin removal to be done in the next 6 weeks

## 2013-11-14 ENCOUNTER — Telehealth: Payer: Self-pay | Admitting: *Deleted

## 2013-11-14 NOTE — Telephone Encounter (Signed)
Confirmed office surgery date and arrival time as 01/04/2014 at 745am.  Pt agreed.

## 2013-11-24 ENCOUNTER — Encounter (HOSPITAL_COMMUNITY): Payer: Self-pay | Admitting: Emergency Medicine

## 2013-11-24 ENCOUNTER — Inpatient Hospital Stay (HOSPITAL_COMMUNITY)
Admission: EM | Admit: 2013-11-24 | Discharge: 2013-11-25 | DRG: 065 | Disposition: A | Discharge status: Home / Self Care | Payer: BC Managed Care – PPO | Attending: Family Medicine | Admitting: Family Medicine

## 2013-11-24 ENCOUNTER — Emergency Department (HOSPITAL_COMMUNITY): Payer: BC Managed Care – PPO

## 2013-11-24 DIAGNOSIS — Z79899 Other long term (current) drug therapy: Secondary | ICD-10-CM

## 2013-11-24 DIAGNOSIS — Z720 Tobacco use: Secondary | ICD-10-CM

## 2013-11-24 DIAGNOSIS — I639 Cerebral infarction, unspecified: Secondary | ICD-10-CM

## 2013-11-24 DIAGNOSIS — F17200 Nicotine dependence, unspecified, uncomplicated: Secondary | ICD-10-CM | POA: Diagnosis present

## 2013-11-24 DIAGNOSIS — I6330 Cerebral infarction due to thrombosis of unspecified cerebral artery: Principal | ICD-10-CM | POA: Diagnosis present

## 2013-11-24 DIAGNOSIS — Z7982 Long term (current) use of aspirin: Secondary | ICD-10-CM

## 2013-11-24 DIAGNOSIS — Z8673 Personal history of transient ischemic attack (TIA), and cerebral infarction without residual deficits: Secondary | ICD-10-CM

## 2013-11-24 DIAGNOSIS — G8190 Hemiplegia, unspecified affecting unspecified side: Secondary | ICD-10-CM | POA: Diagnosis present

## 2013-11-24 LAB — DIFFERENTIAL
BASOS ABS: 0 10*3/uL (ref 0.0–0.1)
Basophils Relative: 0 % (ref 0–1)
EOS ABS: 0.4 10*3/uL (ref 0.0–0.7)
EOS PCT: 4 % (ref 0–5)
Lymphocytes Relative: 35 % (ref 12–46)
Lymphs Abs: 3.2 10*3/uL (ref 0.7–4.0)
Monocytes Absolute: 0.6 10*3/uL (ref 0.1–1.0)
Monocytes Relative: 6 % (ref 3–12)
Neutro Abs: 4.9 10*3/uL (ref 1.7–7.7)
Neutrophils Relative %: 54 % (ref 43–77)

## 2013-11-24 LAB — PROTIME-INR
INR: 0.94 (ref 0.00–1.49)
PROTHROMBIN TIME: 12.4 s (ref 11.6–15.2)

## 2013-11-24 LAB — CBC
HCT: 45.2 % (ref 39.0–52.0)
Hemoglobin: 16 g/dL (ref 13.0–17.0)
MCH: 35.2 pg — AB (ref 26.0–34.0)
MCHC: 35.4 g/dL (ref 30.0–36.0)
MCV: 99.3 fL (ref 78.0–100.0)
PLATELETS: 257 10*3/uL (ref 150–400)
RBC: 4.55 MIL/uL (ref 4.22–5.81)
RDW: 12.6 % (ref 11.5–15.5)
WBC: 9.1 10*3/uL (ref 4.0–10.5)

## 2013-11-24 LAB — COMPREHENSIVE METABOLIC PANEL
ALBUMIN: 3.7 g/dL (ref 3.5–5.2)
ALK PHOS: 91 U/L (ref 39–117)
ALT: 14 U/L (ref 0–53)
AST: 17 U/L (ref 0–37)
BUN: 12 mg/dL (ref 6–23)
CO2: 22 mEq/L (ref 19–32)
Calcium: 8.9 mg/dL (ref 8.4–10.5)
Chloride: 101 mEq/L (ref 96–112)
Creatinine, Ser: 0.98 mg/dL (ref 0.50–1.35)
GFR calc Af Amer: >90 mL/min (ref >90)
GFR calc non Af Amer: 90 mL/min — ABNORMAL LOW (ref >90)
Glucose, Bld: 99 mg/dL (ref 70–99)
Potassium: 4 mEq/L (ref 3.7–5.3)
Sodium: 141 mEq/L (ref 137–147)
TOTAL PROTEIN: 7.2 g/dL (ref 6.0–8.3)
Total Bilirubin: <0.2 mg/dL — ABNORMAL LOW (ref 0.3–1.2)

## 2013-11-24 LAB — POCT I-STAT TROPONIN I: TROPONIN I, POC: 0 ng/mL (ref 0.00–0.08)

## 2013-11-24 LAB — TROPONIN I: Troponin I: <0.3 ng/mL (ref <0.30)

## 2013-11-24 LAB — POCT I-STAT, CHEM 8
BUN: 11 mg/dL (ref 6–23)
CHLORIDE: 101 meq/L (ref 96–112)
Calcium, Ion: 1.14 mmol/L (ref 1.12–1.23)
Creatinine, Ser: 1.2 mg/dL (ref 0.50–1.35)
GLUCOSE: 100 mg/dL — AB (ref 70–99)
HCT: 49 % (ref 39.0–52.0)
Hemoglobin: 16.7 g/dL (ref 13.0–17.0)
POTASSIUM: 3.6 meq/L — AB (ref 3.7–5.3)
Sodium: 140 mEq/L (ref 137–147)
TCO2: 23 mmol/L (ref 0–100)

## 2013-11-24 LAB — APTT: aPTT: 34 seconds (ref 24–37)

## 2013-11-24 LAB — GLUCOSE, CAPILLARY: Glucose-Capillary: 81 mg/dL (ref 70–99)

## 2013-11-24 NOTE — ED Notes (Addendum)
Per Dr. Doy Mince, monitor symptoms if they reappear. If symptoms resolve, then do not call another code stroke and continue to monitor.

## 2013-11-24 NOTE — ED Notes (Signed)
Pt. Called this nurse at 2145 into room due to stroke symptoms reappearing. Pt. Unable to move hold right leg or right arm up against gravity, however, still able to move fingers/toes. Pt. Symptoms last approx 3 minutes before completely resolved.

## 2013-11-24 NOTE — Consult Note (Addendum)
Referring Physician: Alvino Chapel    Chief Complaint: Right sided weakness  HPI: Jason Parsons is a RH 58 y.o. male who was at home working on his computer and had acute onset of inability to use his right arm.  Then noted that he was unable to use his right leg as well.  His symptoms resolved after a few minutes but recurred 3 more times.  Each time it was the same symptoms and patient has spontaneous resolution of symptoms.  Patient called EMS at that time.  On their arrival they found no neurologic deficits.  En route patient was noted to have recurrence of symptoms that again only lasted a few minutes then resolved spontaneously.  Initial NIHSS of 0.    Date last known well: Date: 11/24/2013 Time last known well: Time: 19:20 tPA Given: No: Resolution of symptoms  History reviewed. No pertinent past medical history.  Past Surgical History  Procedure Laterality Date  . Joint replacement    . Bunionectomy    . Plantar fibroma      Family history: Father is deceased but patient does not know the circumstances surrounding his death.  Mother is alive and well.  Only medical problem is arthritis.    Social History:  reports that he has been smoking Cigarettes.  He has been smoking about 0.00 packs per day for the past 35 years. He does not have any smokeless tobacco history on file. He reports that he drinks alcohol. He reports that he does not use illicit drugs.  Allergies: No Known Allergies  Medications: I have reviewed the patient's current medications. Prior to Admission:  Current outpatient prescriptions: aspirin EC 81 MG tablet, Take 81 mg by mouth daily., Disp: , Rfl: ;   cycloSPORINE (RESTASIS) 0.05 % ophthalmic emulsion, Place 1 drop into both eyes daily., Disp: , Rfl: ;   ibuprofen (ADVIL,MOTRIN) 200 MG tablet, Take 200 mg by mouth every 6 (six) hours as needed for moderate pain., Disp: , Rfl: ;  valACYclovir (VALTREX) 500 MG tablet, Take 500 mg by mouth daily., Disp: , Rfl:    ROS: History obtained from the patient  General ROS: negative for - chills, fatigue, fever, night sweats, weight gain or weight loss Psychological ROS: negative for - behavioral disorder, hallucinations, memory difficulties, mood swings or suicidal ideation Ophthalmic ROS: negative for - blurry vision, double vision, eye pain or loss of vision ENT ROS: negative for - epistaxis, nasal discharge, oral lesions, sore throat, tinnitus or vertigo Allergy and Immunology ROS: negative for - hives or itchy/watery eyes Hematological and Lymphatic ROS: negative for - bleeding problems, bruising or swollen lymph nodes Endocrine ROS: negative for - galactorrhea, hair pattern changes, polydipsia/polyuria or temperature intolerance Respiratory ROS: negative for - cough, hemoptysis, shortness of breath or wheezing Cardiovascular ROS: negative for - chest pain, dyspnea on exertion, edema or irregular heartbeat Gastrointestinal ROS: negative for - abdominal pain, diarrhea, hematemesis, nausea/vomiting or stool incontinence Genito-Urinary ROS: negative for - dysuria, hematuria, incontinence or urinary frequency/urgency Musculoskeletal ROS: negative for - joint swelling or muscular weakness Neurological ROS: as noted in HPI Dermatological ROS: negative for rash and skin lesion changes  Physical Examination: Blood pressure 128/85, pulse 68, resp. rate 14, SpO2 96.00%.  Neurologic Examination: Mental Status: Alert, oriented, thought content appropriate.  Speech fluent without evidence of aphasia.  Able to follow 3 step commands without difficulty. Cranial Nerves: II: Discs flat bilaterally; Visual fields grossly normal, pupils equal, round, reactive to light and accommodation III,IV, VI: ptosis  not present, extra-ocular motions intact bilaterally V,VII: decreased right NLF, facial light touch sensation normal bilaterally VIII: hearing normal bilaterally IX,X: gag reflex present XI: bilateral shoulder  shrug XII: midline tongue extension Motor: Right : Upper extremity   5/5 w/5-/5 hand grip   Left:     Upper extremity   5/5  Lower extremity   5-/5       Lower extremity   5/5 Tone and bulk:normal tone throughout; no atrophy noted Sensory: Pinprick and light touch intact throughout, bilaterally Deep Tendon Reflexes: 2+ throughout with an absent left knee jerk Plantars: Right: downgoing   Left: downgoing Cerebellar: normal finger-to-nose and normal heel-to-shin test Gait: Unable to test CV: pulses palpable throughout     Laboratory Studies:  Basic Metabolic Panel:  Recent Labs Lab 11/24/13 2114 11/24/13 2116  NA 141 140  K 4.0 3.6*  CL 101 101  CO2 22  --   GLUCOSE 99 100*  BUN 12 11  CREATININE 0.98 1.20  CALCIUM 8.9  --     Liver Function Tests:  Recent Labs Lab 11/24/13 2114  AST 17  ALT 14  ALKPHOS 91  BILITOT <0.2*  PROT 7.2  ALBUMIN 3.7   No results found for this basename: LIPASE, AMYLASE,  in the last 168 hours No results found for this basename: AMMONIA,  in the last 168 hours  CBC:  Recent Labs Lab 11/24/13 2114 11/24/13 2116  WBC 9.1  --   NEUTROABS 4.9  --   HGB 16.0 16.7  HCT 45.2 49.0  MCV 99.3  --   PLT 257  --     Cardiac Enzymes:  Recent Labs Lab 11/24/13 2114  TROPONINI <0.30    BNP: No components found with this basename: POCBNP,   CBG:  Recent Labs Lab 11/24/13 2140  GLUCAP 81    Microbiology: Results for orders placed in visit on 02/13/13  GC/CHLAMYDIA PROBE AMP     Status: None   Collection Time    02/13/13  9:53 AM      Result Value Range Status   CT Probe RNA NEGATIVE   Final   GC Probe RNA NEGATIVE   Final   Comment:                                                                                            Normal Reference Range: Negative                 Assay performed using the Gen-Probe APTIMA COMBO2 (R) Assay.           Acceptable specimen types for this assay include APTIMA Swabs (Unisex,      endocervical, urethral, or vaginal), first void urine, and ThinPrep     liquid based cytology samples.  HERPES SIMPLEX VIRUS CULTURE     Status: None   Collection Time    02/13/13  9:54 AM      Result Value Range Status   Organism ID, Bacteria No Herpes Simplex Virus detected.   Final    Coagulation Studies:  Recent Labs  11/24/13 2114  LABPROT 12.4  INR 0.94  Urinalysis: No results found for this basename: COLORURINE, APPERANCEUR, LABSPEC, [place], GLUCOSEU, HGBUR, BILIRUBINUR, KETONESUR, PROTEINUR, UROBILINOGEN, NITRITE, LEUKOCYTESUR,  in the last 168 hours  Lipid Panel: No results found for this basename: chol, trig, hdl, cholhdl, vldl, ldlcalc    HgbA1C:  No results found for this basename: HGBA1C    Urine Drug Screen:   No results found for this basename: labopia, cocainscrnur, labbenz, amphetmu, thcu, labbarb    Alcohol Level: No results found for this basename: ETH,  in the last 168 hours  Other results: EKG: sinus rhythm at 67 bpm.  Imaging: Ct Head (brain) Wo Contrast  11/24/2013   CLINICAL DATA:  right arm feels funny  EXAM: CT HEAD WITHOUT CONTRAST  TECHNIQUE: Contiguous axial images were obtained from the base of the skull through the vertex without intravenous contrast.  COMPARISON:  None.  FINDINGS: No acute intracranial hemorrhage. No focal mass lesion. No CT evidence of acute infarction. No midline shift or mass effect. No hydrocephalus. Basilar cisterns are patent. Paranasal sinuses and mastoid air cells are clear.  IMPRESSION: 1. No CT evidence of cortical infarction. 2. No intracranial hemorrhage. Findings conveyed toDr. Minta Balsam 11/24/2013  at21:31.   Electronically Signed   By: Suzy Bouchard M.D.   On: 11/24/2013 21:30    Assessment: 58 y.o. male presenting with intermittent episodes of right sided weakness.  Symptoms at this time on neurological examination are minor but suspect that he has had a small lacunar event.  Currently with a NIHSS of 0  therefore not a tPA candidate but will need to continue to monitor since within the tPA window.  Further work up recommended.    Stroke Risk Factors - smoking  Plan: 1. HgbA1c, fasting lipid panel 2. MRI, MRA  of the brain without contrast.  MRI to be performed tonight.  With intermittent symptoms will need to determine if the clock for tPA can be reset.   3. PT consult, OT consult, Speech consult 4. Echocardiogram 5. Carotid dopplers 6. Prophylactic therapy-Antiplatelet med: Aspirin - dose 325mg  daily 7. Smoking cessation counseling 8. Telemetry monitoring 9. Frequent neuro checks  This patient is critically ill and at significant risk of neurological worsening, death and care requires constant monitoring of vital signs, hemodynamics,respiratory and cardiac monitoring, neurological assessment, discussion with family, other specialists and medical decision making of high complexity. I spent 75 minutes of neurocritical care time  in the care of  this patient.    Alexis Goodell, MD [facility] [phone] 11/24/2013, 10:47 PM

## 2013-11-24 NOTE — ED Notes (Signed)
Per EMS, pt was sitting at home in front of computer when noticed he couldn't use right arm or right leg, lasted approx 2-3 minutes and symptoms resolved completely. Pt. States had 4 more episodes of same before he called EMS. With EMS patient had episode lasting <5 min. Symptoms resolved around 2100. Pt. Asymptomatic on arrival.

## 2013-11-24 NOTE — H&P (Signed)
[facility] Admission History and Physical Service Pager: [phone]  Patient name: Jason Parsons Medical record number: [id number] Date of birth: 28-Aug-1956 Age: 57 y.o. Gender: male  Primary Care Provider: Jenny Reichmann, MD Consultants: Neurology Code Status: Full  Chief Complaint: R arm and leg weakness  Assessment and Plan: Jason Parsons is a 58 y.o. male presenting with intermittent right arm and right leg weakness suspicious for stroke versus TIA . PMH is significant for smoking and previous TIA.   TIA versus stroke - Likely stroke given his findings on MRI, however his intermittent symptoms are not typical.  - Neurology consulted - appreciate recommendations, given intermittent symptoms and MRI appearance there will consider restarting the clock for TPA should his symptoms resume and persist - For now we'll proceed with full stroke workup, CT no acute bleed, MRI with possible very acute infarct - Carotid Dopplers and TTE ordered - Continue aspirin, consider statin - Lipid panel and A1c in the am - PT, OT, speech pathology consult - Discussed smoking cessation - Admit to neurology telemetry unit, Q2 hour neuro checks per their protocol  Tobacco abuse- discussed cessation especially given his current TIA versus stroke  FEN/GI: SLIV, NPO until cleared by RN swallow screen then normal diet Prophylaxis: Heparin  Disposition: Admit to telemetry for workup and frequent neurochecks  History of Present Illness: Jason Parsons is a 58 y.o. male presenting with intermittent right arm and right leg weakness. He explains that around 720 next night and had sudden onset of right arm weakness followed by right leg weakness which resolved after a few minutes. This occurred 3 additional times and he called EMS. By the time EMS arrived he had no neuro deficits but then had symptoms again en route. He is again resolved and then recurred again wall in the ED. He denies any  other symptoms today including dysarthria, dysphagia, and confusion. He states that he has smoked one half packs per day since age 69 (69 years). He denies any other medical problems such as diabetes, hyperlipidemia, and hypertension. He denies chest pain, dyspnea, abdominal pain, bowel or bladder dysfunction, and lower extremity edema.  In the ED he's had a CT of his head and MRI brain showing a possible acute infarct of the left posterior limb of the internal capsule. He appears to be outside of the TPA window for his initial symptoms in his NIH stroke score on examination by neurology was 0. If his symptoms recur urology will be contacted to see if the clock for TPA should be reset.  Review Of Systems: Per HPI with the following additions: Otherwise 12 point review of systems was performed and was unremarkable.  Patient Active Problem List   Diagnosis Date Noted  . Stroke 11/25/2013  . CVA (cerebral infarction) 11/24/2013   Past Medical History: History reviewed. No pertinent past medical history. Past Surgical History: Past Surgical History  Procedure Laterality Date  . Joint replacement    . Bunionectomy    . Plantar fibroma     Social History: History  Substance Use Topics  . Smoking status: Current Every Day Smoker -- 35 years    Types: Cigarettes  . Smokeless tobacco: Not on file  . Alcohol Use: Yes   Additional social history: Please also refer to relevant sections of EMR.  Family History: No family history on file. Allergies and Medications: No Known Allergies No current facility-administered medications on file prior to encounter.   No current  outpatient prescriptions on file prior to encounter.    Objective: BP 104/64  Pulse 61  Temp(Src) 97.5 F (36.4 C)  Resp 15  SpO2 96% Exam: Gen: NAD, alert, cooperative with exam, sleeping comfortably HEENT: NCAT, EOMI, PERRL, MMM CV: RRR, good S1/S2, no murmur Resp: CTABL, no wheezes, non-labored Abd: SNTND, BS  present, no guarding or organomegaly Ext: No edema, warm, 2+ DP pulses Neuro: Alert and oriented, 5/5 and sensation intact in all 4 extremities, symmetric face, normal speech, complete neruo exam deferred as patient was very sleepy but cooperative, no apparent changes from neuro note earlier   Labs and Imaging: CBC BMET   Recent Labs Lab 11/24/13 2114 11/24/13 2116  WBC 9.1  --   HGB 16.0 16.7  HCT 45.2 49.0  PLT 257  --     Recent Labs Lab 11/24/13 2114 11/24/13 2116  NA 141 140  K 4.0 3.6*  CL 101 101  CO2 22  --   BUN 12 11  CREATININE 0.98 1.20  GLUCOSE 99 100*  CALCIUM 8.9  --      Troponin negative x1, point-of-care troponin negative x1 and also  MRI/MRA Brain 11/24/2013 IMPRESSION:  MRI head: No acute intracranial process, specifically no evidence of  acute ischemia.  Mild white matter changes suggest chronic small ischemic disease  with right cingulate gyrus/ mesial frontal lobe encephalomalacia  which may reflect remote anterior cerebral artery territory infarct  or, less likely remote trauma.  MRA head: No hemodynamically significant stenosis. ADDENDUM:  Upon Re review, there is subtle subcentimeter asymmetric bright  signal on the DWI sequence along the posterior limb of the left  greater than right internal capsule, axial 17/60, coronal 16/66.  There is equivocal corresponding low ADC values, and though this may  reflect artifact associated with the white matter bundle, a subtle  very acute infarct of the left posterior limb of the internal  capsule could have a similar appearance.  CT head 11/24/2013 IMPRESSION:  1. No CT evidence of cortical infarction.  2. No intracranial hemorrhage.  EKG 11/25/2013: Normal sinus rhythm, left ventricular hypertrophy, PVCs  Timmothy Euler, MD 11/25/2013, 12:24 AM PGY-2, [place] Intern pager: [phone], text pages welcome

## 2013-11-24 NOTE — ED Notes (Addendum)
Pt. Had another TIA episode starting at 2155. Unable to hold right arm/right leg up against gravity. Pt. Also had some dysarthria initially which resolved within 10-15 sec. All symptoms resolved at 2208

## 2013-11-25 DIAGNOSIS — F17200 Nicotine dependence, unspecified, uncomplicated: Secondary | ICD-10-CM

## 2013-11-25 DIAGNOSIS — Z720 Tobacco use: Secondary | ICD-10-CM | POA: Diagnosis present

## 2013-11-25 DIAGNOSIS — I639 Cerebral infarction, unspecified: Secondary | ICD-10-CM | POA: Diagnosis present

## 2013-11-25 DIAGNOSIS — I6350 Cerebral infarction due to unspecified occlusion or stenosis of unspecified cerebral artery: Secondary | ICD-10-CM

## 2013-11-25 LAB — LIPID PANEL
CHOLESTEROL: 175 mg/dL (ref 0–200)
HDL: 42 mg/dL (ref >39)
LDL Cholesterol: 113 mg/dL — ABNORMAL HIGH (ref 0–99)
TRIGLYCERIDES: 99 mg/dL (ref <150)
Total CHOL/HDL Ratio: 4.2 RATIO
VLDL: 20 mg/dL (ref 0–40)

## 2013-11-25 LAB — HEMOGLOBIN A1C
Hgb A1c MFr Bld: 5.5 % (ref <5.7)
Mean Plasma Glucose: 111 mg/dL (ref <117)

## 2013-11-25 MED ORDER — SENNOSIDES-DOCUSATE SODIUM 8.6-50 MG PO TABS: 1.0000 | ORAL_TABLET | Freq: Every evening | ORAL | Status: DC | PRN

## 2013-11-25 MED ORDER — HEPARIN SODIUM (PORCINE) 5000 UNIT/ML IJ SOLN
5000.0000 [IU] | Freq: Three times a day (TID) | INTRAMUSCULAR | Status: DC
  Administered 2013-11-25 (×2): 5000 [IU] via SUBCUTANEOUS
  Filled 2013-11-25 (×4): qty 1

## 2013-11-25 MED ORDER — ATORVASTATIN CALCIUM 20 MG PO TABS: 20.0000 mg | ORAL_TABLET | Freq: Every day | ORAL | Status: DC

## 2013-11-25 MED ORDER — IBUPROFEN 400 MG PO TABS
400.0000 mg | ORAL_TABLET | Freq: Four times a day (QID) | ORAL | Status: DC | PRN
  Filled 2013-11-25: qty 1

## 2013-11-25 MED ORDER — CYCLOSPORINE 0.05 % OP EMUL
1.0000 [drp] | Freq: Every day | OPHTHALMIC | Status: DC
  Filled 2013-11-25: qty 1

## 2013-11-25 MED ORDER — ASPIRIN EC 81 MG PO TBEC
81.0000 mg | DELAYED_RELEASE_TABLET | Freq: Every day | ORAL | Status: DC
  Administered 2013-11-25: 81 mg via ORAL
  Filled 2013-11-25: qty 1

## 2013-11-25 MED ORDER — ASPIRIN 325 MG PO TBEC: 325.0000 mg | DELAYED_RELEASE_TABLET | Freq: Every day | ORAL | Status: AC

## 2013-11-25 MED ORDER — ASPIRIN EC 325 MG PO TBEC: 325.0000 mg | DELAYED_RELEASE_TABLET | Freq: Every day | ORAL | Status: DC

## 2013-11-25 NOTE — Discharge Instructions (Signed)
We would like for you to start a blood vessel protecting medication called Lipitor.  This is also a cholesterol-lowering medicine but this medication is indicated for you do to your 9.2% risk of a heart attack or stroke in the next 10 years when assessing your risk factors including blood pressure, smoking status, age and race.  Please increase your aspirin to 325 mg daily.  Please follow with Dr. Everlene Farrier as an outpatient for further evaluation.

## 2013-11-25 NOTE — Progress Notes (Signed)
Pt arrived to 4N20 by Venus Threadgill via stretcher. Pt ambulated to bed. Pt alert oriented x4 and oriented to room with call bell and bed alarm on. Pt educated about unit, answered questions. Pt very tired, falling asleep during assessment. Will continue to monitor.

## 2013-11-25 NOTE — Discharge Summary (Signed)
[facility] Discharge Summary  Patient name: Jason Parsons Medical record number: [id number] Date of birth: [birth year] Age: 58 y.o. Gender: male Date of Admission: 11/24/2013  Date of Discharge: 11/25/12 Admitting Physician: Dickie La, MD  Primary Care Provider: Jenny Reichmann, MD Consultants: Stroke Team/Neuro  Indication for Hospitalization: R Arm and Leg Weakness  Discharge Diagnoses/Problem List:  Principal Problem:   CVA (cerebral infarction) Active Problems:   Stroke   Tobacco abuse  Disposition: To Home  Discharge Condition: Improved  Brief Hospital Course:  Patient initially presented with acute onset of right arm and leg weakness that intermittently lasted between 10 and 15 minutes on multiple occasions including in the presence of EMS.  Symptoms have completely resolved prior to arrival to the hospital.  Code stroke was initiated and CT scan did not reveal an acute stroke but do to his stroke score of 0 he was not a candidate for TPA.  His symptoms did not return throughout hospitalization.  MRI showed a very small area that could be reflective of acute infarct versus artifact. (See report below).   - Carotid Dopplers were obtained that did not show any significant stenosis. - Risk stratification labs were remarkable for a marginally elevated LDL.  ASCVD risk of 9% and patient was initiated on statin therapy at discharge. - ASA increased to 325mg  - Patient is a current every day smoker; encouraged to quit - No evidence of hypertension - Patient was requesting discharge and will follow up with his primary doctor to undergo an outpatient echocardiogram in the next 2-3 days.   - He was previously on antiepileptic therapy but this was discontinued in 2007.  The symptoms are consistent with his prior symptoms of seizure and there is concern for a simple partial seizure as etiology.  No antiepileptic medications recommended.  Follow up with outpatient  neurology.   Issues for Follow Up:  - Needs a 2-D echo performed as an outpatient. - Insure he is started on his statin therapy.  Cholesterol panel drawn above.  Ten-year ASCVD risk 9.2%. - outpatient EEG for potential of seizure evaluation.  Patient to call for this appointment.   Significant Procedures:  Carotid Dopplers, negative  LABS:  Basic Labs Extended:   Recent Labs Lab 11/24/13 2114 11/24/13 2116  WBC 9.1  --   HGB 16.0 16.7  HCT 45.2 49.0  PLT 257  --     Recent Labs Lab 11/24/13 2114 11/24/13 2116  NA 141 140  K 4.0 3.6*  CL 101 101  CO2 22  --   BUN 12 11  CREATININE 0.98 1.20  GLUCOSE 99 100*  CALCIUM 8.9  --     Recent Labs Lab 11/24/13 2114  ALBUMIN 3.7  ALT 14  AST 17  ALKPHOS 91  BILITOT <0.2*    Recent Labs Lab 11/24/13 2114 11/24/13 2119  TROPIPOC  --  0.00  TROPONINI <0.30  --     Recent Labs  11/25/13 0400  HGBA1C 5.5  TRIG 99  CHOL 175  HDL 42  LDLCALC 113*       IMAGING: Ct Head (brain) Wo Contrast 11/24/2013   CLINICAL DATA:  right arm feels funny  EXAM: CT HEAD WITHOUT CONTRAST  TECHNIQUE: Contiguous axial images were obtained from the base of the skull through the vertex without intravenous contrast.  COMPARISON:  None.  FINDINGS: No acute intracranial hemorrhage. No focal mass lesion. No CT evidence of acute infarction. No midline shift  or mass effect. No hydrocephalus. Basilar cisterns are patent. Paranasal sinuses and mastoid air cells are clear.  IMPRESSION: 1. No CT evidence of cortical infarction. 2. No intracranial hemorrhage. Findings conveyed toDr. Trudie Reed 11/24/2013  at21:31.   Electronically Signed   By: Genevive Bi M.D.   On: 11/24/2013 21:30   Mr Maxine Glenn Head Wo Contrast 11/24/2013   ADDENDUM REPORT: 11/24/2013 23:30  ADDENDUM: Upon Re review, there is subtle subcentimeter asymmetric bright signal on the DWI sequence along the posterior limb of the left greater than right internal capsule, axial 17/60,  coronal 16/66. There is equivocal corresponding low ADC values, and though this may reflect artifact associated with the white matter bundle, a subtle very acute infarct of the left posterior limb of the internal capsule could have a similar appearance.  Findings discussed with and reconfirmed by Dr. Thana Farr on November 24, 2013 at 2320 hours.   Electronically Signed   By: Awilda Metro   On: 11/24/2013 23:30   11/24/2013   CLINICAL DATA:  Intermittent right arm and leg weakness. Asymptomatic on arrival.  EXAM: MRI HEAD WITHOUT CONTRAST  MRA HEAD WITHOUT CONTRAST  TECHNIQUE: Multiplanar, multiecho pulse sequences of the brain and surrounding structures were obtained without intravenous contrast. Angiographic images of the head were obtained using MRA technique without contrast.  COMPARISON:  CT of the head November 24, 2013 at 2109 hr.  FINDINGS: MRI HEAD FINDINGS (moderately motion degraded coronal T2 sequence)  No reduced diffusion to suggest acute ischemia. No susceptibility artifact to suggest hemorrhage.  The ventricles and sulci are normal for patient's age. Right mesial frontal/ cingulate gyrus encephalomalacia, axial 14/22. Associated very mild ex vacuo dilatation of the right frontal horn of the lateral ventricle. A few scattered supratentorial subcentimeter white matter FLAIR T2 hyperintensities are seen, with mild cystic degeneration of a white matter lesion in the right frontal deep white matter. No intraparenchymal mass effect or midline shift.  No abnormal extra-axial fluid collections. Ocular globes and orbital contents are unremarkable though not tailored for evaluation. Trace paranasal sinus mucosal thickening without air-fluid levels. No suspicious calvarial bone marrow signal. No abnormal sellar expansion. No cerebellar tonsillar descent below the foramen magnum.  MRA HEAD FINDINGS  Anterior circulation: Normal flow related enhancement of the included cervical, petrous, cavernous and  supra clinoid internal carotid arteries. The anterior communicating artery is not well demonstrated. Normal flow related enhancement of the anterior and middle cerebral arteries, including more distal segments.  Posterior circulation: Right vertebral artery is mildly dominant. Basilar artery is patent, with normal flow related enhancement of the main branch vessels. Partially fenestrated origin of the basilar artery. Diminutive left P1 segment with rib os compensatory contribution of from a left posterior communicating artery. Normal flow related enhancement of the posterior cerebral arteries.  No large vessel occlusion, hemodynamically significant stenosis, abnormal luminal irregularity, aneurysm within the anterior nor posterior circulation.  IMPRESSION: MRI head: No acute intracranial process, specifically no evidence of acute ischemia.  Mild white matter changes suggest chronic small ischemic disease with right cingulate gyrus/ mesial frontal lobe encephalomalacia which may reflect remote anterior cerebral artery territory infarct or, less likely remote trauma.  MRA head:  No hemodynamically significant stenosis.  Electronically Signed: By: Awilda Metro On: 11/24/2013 23:09   Discharge Medications:    Medication List         aspirin 325 MG EC tablet  Take 1 tablet (325 mg total) by mouth daily.  Start taking on:  11/26/2013  atorvastatin 20 MG tablet  Commonly known as:  LIPITOR  Take 1 tablet (20 mg total) by mouth daily. For blood vessel protection to reduce your risk of stroke.     cycloSPORINE 0.05 % ophthalmic emulsion  Commonly known as:  RESTASIS  Place 1 drop into both eyes daily.     ibuprofen 200 MG tablet  Commonly known as:  ADVIL,MOTRIN  Take 200 mg by mouth every 6 (six) hours as needed for moderate pain.     valACYclovir 500 MG tablet  Commonly known as:  VALTREX  Take 500 mg by mouth daily.        Discharge Instructions: Please refer to Patient Instructions  section of EMR for full details.  Patient was counseled important signs and symptoms that should prompt return to medical care, changes in medications, dietary instructions, activity restrictions, and follow up appointments.   Follow-Up Appointments: Follow-up Information   Schedule an appointment as soon as possible for a visit with Jenny Reichmann, MD.   Specialty:  Family Medicine   Contact information:   [place] [address] [phone]       Call [facility]. (for Out patient Neurology follow up (or with your previous neurologist))    Specialty:  Neurology   Contact information:   [address] [id number] [place] [address] [place], DO 11/25/2013, 4:44 PM PGY-3, [place]

## 2013-11-25 NOTE — Progress Notes (Signed)
Called ED for report 0027, not able to give report at that time. Left number to call back. Jason Parsons

## 2013-11-25 NOTE — H&P (Signed)
[facility] Attending Admission Note: Dorcas Mcmurray MD [phone] pager office [phone] I  have seen and examined this patient, reviewed their chart. I have discussed this patient with the resident. I agree with the resident's findings, assessment and care plan. Appreciate neurology consult.

## 2013-11-25 NOTE — Progress Notes (Signed)
Stroke Team Progress Note  HISTORY Jason Parsons is a RH 58 y.o. male who was at home working on his computer 11/24/2013 and had acute onset of inability to use his right arm. Then noted that he was unable to use his right leg as well. His symptoms resolved after a few minutes but recurred 3 more times. Each time it was the same symptoms and patient had spontaneous resolution of symptoms. Patient called EMS. On their arrival they found no neurologic deficits. En route patient was noted to have recurrence of symptoms that again only lasted a few minutes then resolved spontaneously. Initial NIHSS of 0.    Date last known well: Date: 11/24/2013  Time last known well: Time: 19:20  tPA Given: No: Resolution of symptoms  SUBJECTIVE The patient's wife is at the bedside. The patient states that he is almost back to baseline; however, his right side does not feel completely normal. He reports that he had a seizure at some point in the past and had been on medication. This was eventually discontinued. Dr Irish Elders feels the patient should have an EEG although this could be done on an outpatient basis. The patient is very anxious for discharge. He was instructed that he should not drive until he is cleared by neurology.  OBJECTIVE Most recent Vital Signs: Filed Vitals:   11/25/13 0110 11/25/13 0415 11/25/13 0600 11/25/13 0819  BP: 140/74 132/81 105/66 128/83  Pulse: 64 64 61 68  Temp: 97.4 F (36.3 C) 97.3 F (36.3 C) 98.6 F (37 C) 97.7 F (36.5 C)  TempSrc: Oral Oral Oral Oral  Resp: 18 18 20 18   SpO2: 99% 100% 96% 96%   CBG (last 3)   Recent Labs  11/24/13 2140  GLUCAP 81    IV Fluid Intake:     MEDICATIONS  . aspirin EC  81 mg Oral Daily  . cycloSPORINE  1 drop Both Eyes Daily  . heparin  5,000 Units Subcutaneous Q8H   PRN:  ibuprofen, senna-docusate  Diet:  General with thin liquids Activity:  Up with assistance DVT Prophylaxis:  Subcutaneous heparin  CLINICALLY SIGNIFICANT  STUDIES Basic Metabolic Panel:   Recent Labs Lab 11/24/13 2114 11/24/13 2116  NA 141 140  K 4.0 3.6*  CL 101 101  CO2 22  --   GLUCOSE 99 100*  BUN 12 11  CREATININE 0.98 1.20  CALCIUM 8.9  --    Liver Function Tests:   Recent Labs Lab 11/24/13 2114  AST 17  ALT 14  ALKPHOS 91  BILITOT <0.2*  PROT 7.2  ALBUMIN 3.7   CBC:   Recent Labs Lab 11/24/13 2114 11/24/13 2116  WBC 9.1  --   NEUTROABS 4.9  --   HGB 16.0 16.7  HCT 45.2 49.0  MCV 99.3  --   PLT 257  --    Coagulation:   Recent Labs Lab 11/24/13 2114  LABPROT 12.4  INR 0.94   Cardiac Enzymes:   Recent Labs Lab 11/24/13 2114  TROPONINI <0.30   Urinalysis: No results found for this basename: COLORURINE, APPERANCEUR, LABSPEC, PHURINE, GLUCOSEU, HGBUR, BILIRUBINUR, KETONESUR, PROTEINUR, UROBILINOGEN, NITRITE, LEUKOCYTESUR,  in the last 168 hours Lipid Panel    Component Value Date/Time   CHOL 175 11/25/2013 0400   TRIG 99 11/25/2013 0400   HDL 42 11/25/2013 0400   CHOLHDL 4.2 11/25/2013 0400   VLDL 20 11/25/2013 0400   LDLCALC 113* 11/25/2013 0400   HgbA1C  No results found for this basename:  HGBA1C    Urine Drug Screen:   No results found for this basename: labopia,  cocainscrnur,  labbenz,  amphetmu,  thcu,  labbarb    Alcohol Level: No results found for this basename: ETH,  in the last 168 hours  Ct Head (brain) Wo Contrast 11/24/2013    1. No CT evidence of cortical infarction. 2. No intracranial hemorrhage.   MRI HEAD 11/24/2013   No acute intracranial process, specifically no evidence of acute ischemia.  Mild white matter changes suggest chronic small ischemic disease with right cingulate gyrus/ mesial frontal lobe encephalomalacia which may reflect remote anterior cerebral artery territory infarct or, less likely remote trauma.    MRI Head Wo Contrast 11/24/2013   23:30  ADDENDUM: Upon Re review, there is subtle subcentimeter asymmetric bright signal on the DWI sequence along the  posterior limb of the left greater than right internal capsule, axial 17/60, coronal 16/66. There is equivocal corresponding low ADC values, and though this may reflect artifact associated with the white matter bundle, a subtle very acute infarct of the left posterior limb of the internal capsule could have a similar appearance.   MRA head:   No hemodynamically significant stenosis.      2D Echocardiogram  pending  Carotid Doppler  pending  EKG -  sinus rhythm 67 beats per minute  Therapy Recommendations - pending  Physical Exam    Neurologic Examination:  Mental Status:  Alert, oriented, thought content appropriate. Speech fluent without evidence of aphasia. Able to follow 3 step commands without difficulty.  Cranial Nerves:  II: Discs flat bilaterally; Visual fields grossly normal, pupils equal, round, reactive to light and accommodation  III,IV, VI: ptosis not present, extra-ocular motions intact bilaterally  V,VII: decreased right NLF, facial light touch sensation normal bilaterally  VIII: hearing normal bilaterally  IX,X: gag reflex present  XI: bilateral shoulder shrug  XII: midline tongue extension  Motor:  Right : Upper extremity 5/5 w/5-/5 hand grip Left: Upper extremity 5/5  Lower extremity 5-/5 Lower extremity 5/5  Tone and bulk:normal tone throughout; no atrophy noted  Sensory: Pinprick and light touch intact throughout, bilaterally  Deep Tendon Reflexes: 2+ throughout with an absent left knee jerk  Plantars:  Right: downgoing Left: downgoing  Cerebellar:  normal finger-to-nose and normal heel-to-shin test  Gait: Unable to test  CV: pulses palpable throughout    ASSESSMENT [name] is a 58 y.o. male presenting with waxing and waning right hemiparesis. TPA was not given as the patient's deficits had resolved on arrival to the ED. An MRI revealed a possible subtle very acute infarct of the left posterior limb of the internal capsule vs artifact. Infarct  felt to be thrombotic secondary to small vessel disease. On aspirin 81 mg orally every day prior to admission. Now on aspirin 81 mg orally every day for secondary stroke prevention. Patient with resultant intermittent right hemiparesis. Work up underway.   Tobacco use  LDL 113 - consider statin therapy  Hospital day # 1  TREATMENT/PLAN  Consider increasing aspirin to 325 mg daily  Await therapy evaluations  Await 2-D echo carotid Dopplers  Risk factor modifications - smoking cessation  Outpatient EEG  Followup Dr. Leonie Man after discharge.  Mikey Bussing PA-C [facility] Pager [phone] 11/25/2013, 9:50 AM  I have personally obtained a history, examined the patient, evaluated imaging results, and formulated the assessment and plan of care. I agree with the above.  Pt had seizure activity in 2007  and was on AED at that time which was discontinued. Symptoms at that time similar to current including numbness R side. Currently asymptomatic.  ? Simple partial seizure this time. Follow up with Neurology as out pt and f/up with EEG monitoring. No antiepileptic medication at this time.  Leotis Pain

## 2013-11-25 NOTE — Progress Notes (Signed)
*  PRELIMINARY RESULTS* Vascular Ultrasound Carotid Duplex (Doppler) has been completed.  Findings suggest 1-39% internal carotid artery stenosis bilaterally. Vertebral arteries are patent with antegrade flow.  11/25/2013 11:49 AM Maudry Mayhew, RVT, RDCS, RDMS

## 2013-11-25 NOTE — ED Provider Notes (Signed)
CSN: [id number]     Arrival date & time 11/24/13  2108 History   First MD Initiated Contact with Patient 11/24/13 2115     Chief Complaint  Patient presents with  . Code Stroke   (Consider location/radiation/quality/duration/timing/severity/associated sxs/prior Treatment) The history is provided by the patient.   patient came in as a code stroke. At around 720 developed right-sided weakness. It lasted around 10-15 minutes. He then had another episode later that also was brief. PA grossly resolved upon arrival to the ER. No headache. No confusion. No trauma. He has not had episodes of this before, but states he did previously have problems with his left side. He continues to smoke.  History reviewed. No pertinent past medical history. Past Surgical History  Procedure Laterality Date  . Joint replacement    . Bunionectomy    . Plantar fibroma     No family history on file. History  Substance Use Topics  . Smoking status: Current Every Day Smoker -- 35 years    Types: Cigarettes  . Smokeless tobacco: Not on file  . Alcohol Use: Yes    Review of Systems  Constitutional: Negative for activity change and appetite change.  Eyes: Negative for pain.  Respiratory: Negative for chest tightness and shortness of breath.   Cardiovascular: Negative for chest pain and leg swelling.  Gastrointestinal: Negative for nausea, vomiting, abdominal pain and diarrhea.  Genitourinary: Negative for flank pain.  Musculoskeletal: Negative for back pain and neck stiffness.  Skin: Negative for rash.  Neurological: Positive for weakness. Negative for numbness and headaches.  Psychiatric/Behavioral: Negative for behavioral problems.    Allergies  Review of patient's allergies indicates no known allergies.  Home Medications   Current Outpatient Rx  Name  Route  Sig  Dispense  Refill  . aspirin EC 81 MG tablet   Oral   Take 81 mg by mouth daily.         . cycloSPORINE (RESTASIS) 0.05 % ophthalmic  emulsion   Both Eyes   Place 1 drop into both eyes daily.         Marland Kitchen ibuprofen (ADVIL,MOTRIN) 200 MG tablet   Oral   Take 200 mg by mouth every 6 (six) hours as needed for moderate pain.         . valACYclovir (VALTREX) 500 MG tablet   Oral   Take 500 mg by mouth daily.          BP 113/48  Pulse 63  Temp(Src) 97.5 F (36.4 C)  Resp 12  SpO2 96% Physical Exam  Nursing note and vitals reviewed. Constitutional: He is oriented to person, place, and time. He appears well-developed and well-nourished.  HENT:  Head: Normocephalic and atraumatic.  Eyes: EOM are normal. Pupils are equal, round, and reactive to light.  Neck: Normal range of motion. Neck supple.  Cardiovascular: Normal rate, regular rhythm and normal heart sounds.   No murmur heard. Pulmonary/Chest: Effort normal and breath sounds normal.  Abdominal: Soft. Bowel sounds are normal. He exhibits no distension and no mass. There is no tenderness. There is no rebound and no guarding.  Musculoskeletal: Normal range of motion. He exhibits no edema.  Neurological: He is alert and oriented to person, place, and time. No cranial nerve deficit.  Grip strength grossly equal bilaterally. Straight leg rise bilaterally. Good strength in bilateral upper extremities. Face is symmetric. Complete NIH score done by neurology.  Skin: Skin is warm and dry.  Psychiatric: He has a normal mood  and affect.    ED Course  Procedures (including critical care time) Labs Review Labs Reviewed  CBC - Abnormal; Notable for the following:    MCH 35.2 (*)    All other components within normal limits  COMPREHENSIVE METABOLIC PANEL - Abnormal; Notable for the following:    Total Bilirubin <0.2 (*)    GFR calc non Af Amer 90 (*)    All other components within normal limits  POCT I-STAT, CHEM 8 - Abnormal; Notable for the following:    Potassium 3.6 (*)    Glucose, Bld 100 (*)    All other components within normal limits  PROTIME-INR  APTT   DIFFERENTIAL  TROPONIN I  GLUCOSE, CAPILLARY  POCT I-STAT TROPONIN I   Imaging Review Ct Head (brain) Wo Contrast  11/24/2013   CLINICAL DATA:  right arm feels funny  EXAM: CT HEAD WITHOUT CONTRAST  TECHNIQUE: Contiguous axial images were obtained from the base of the skull through the vertex without intravenous contrast.  COMPARISON:  None.  FINDINGS: No acute intracranial hemorrhage. No focal mass lesion. No CT evidence of acute infarction. No midline shift or mass effect. No hydrocephalus. Basilar cisterns are patent. Paranasal sinuses and mastoid air cells are clear.  IMPRESSION: 1. No CT evidence of cortical infarction. 2. No intracranial hemorrhage. Findings conveyed toDr. Minta Balsam 11/24/2013  at21:31.   Electronically Signed   By: Suzy Bouchard M.D.   On: 11/24/2013 21:30   Mr Jodene Nam Head Wo Contrast  11/24/2013   ADDENDUM REPORT: 11/24/2013 23:30  ADDENDUM: Upon Re review, there is subtle subcentimeter asymmetric bright signal on the DWI sequence along the posterior limb of the left greater than right internal capsule, axial 17/60, coronal 16/66. There is equivocal corresponding low ADC values, and though this may reflect artifact associated with the white matter bundle, a subtle very acute infarct of the left posterior limb of the internal capsule could have a similar appearance.  Findings discussed with and reconfirmed by Dr. Alexis Goodell on November 24, 2013 at 2320 hours.   Electronically Signed   By: Elon Alas   On: 11/24/2013 23:30   11/24/2013   CLINICAL DATA:  Intermittent right arm and leg weakness. Asymptomatic on arrival.  EXAM: MRI HEAD WITHOUT CONTRAST  MRA HEAD WITHOUT CONTRAST  TECHNIQUE: Multiplanar, multiecho pulse sequences of the brain and surrounding structures were obtained without intravenous contrast. Angiographic images of the head were obtained using MRA technique without contrast.  COMPARISON:  CT of the head November 24, 2013 at 2109 hr.  FINDINGS: MRI HEAD  FINDINGS (moderately motion degraded coronal T2 sequence)  No reduced diffusion to suggest acute ischemia. No susceptibility artifact to suggest hemorrhage.  The ventricles and sulci are normal for patient's age. Right mesial frontal/ cingulate gyrus encephalomalacia, axial 14/22. Associated very mild ex vacuo dilatation of the right frontal horn of the lateral ventricle. A few scattered supratentorial subcentimeter white matter FLAIR T2 hyperintensities are seen, with mild cystic degeneration of a white matter lesion in the right frontal deep white matter. No intraparenchymal mass effect or midline shift.  No abnormal extra-axial fluid collections. Ocular globes and orbital contents are unremarkable though not tailored for evaluation. Trace paranasal sinus mucosal thickening without air-fluid levels. No suspicious calvarial bone marrow signal. No abnormal sellar expansion. No cerebellar tonsillar descent below the foramen magnum.  MRA HEAD FINDINGS  Anterior circulation: Normal flow related enhancement of the included cervical, petrous, cavernous and supra clinoid internal carotid arteries. The anterior communicating artery  is not well demonstrated. Normal flow related enhancement of the anterior and middle cerebral arteries, including more distal segments.  Posterior circulation: Right vertebral artery is mildly dominant. Basilar artery is patent, with normal flow related enhancement of the main branch vessels. Partially fenestrated origin of the basilar artery. Diminutive left P1 segment with rib os compensatory contribution of from a left posterior communicating artery. Normal flow related enhancement of the posterior cerebral arteries.  No large vessel occlusion, hemodynamically significant stenosis, abnormal luminal irregularity, aneurysm within the anterior nor posterior circulation.  IMPRESSION: MRI head: No acute intracranial process, specifically no evidence of acute ischemia.  Mild white matter changes  suggest chronic small ischemic disease with right cingulate gyrus/ mesial frontal lobe encephalomalacia which may reflect remote anterior cerebral artery territory infarct or, less likely remote trauma.  MRA head:  No hemodynamically significant stenosis.  Electronically Signed: By: Elon Alas On: 11/24/2013 23:09   Mr Brain Wo Contrast  11/24/2013   ADDENDUM REPORT: 11/24/2013 23:30  ADDENDUM: Upon Re review, there is subtle subcentimeter asymmetric bright signal on the DWI sequence along the posterior limb of the left greater than right internal capsule, axial 17/60, coronal 16/66. There is equivocal corresponding low ADC values, and though this may reflect artifact associated with the white matter bundle, a subtle very acute infarct of the left posterior limb of the internal capsule could have a similar appearance.  Findings discussed with and reconfirmed by Dr. Alexis Goodell on November 24, 2013 at 2320 hours.   Electronically Signed   By: Elon Alas   On: 11/24/2013 23:30   11/24/2013   CLINICAL DATA:  Intermittent right arm and leg weakness. Asymptomatic on arrival.  EXAM: MRI HEAD WITHOUT CONTRAST  MRA HEAD WITHOUT CONTRAST  TECHNIQUE: Multiplanar, multiecho pulse sequences of the brain and surrounding structures were obtained without intravenous contrast. Angiographic images of the head were obtained using MRA technique without contrast.  COMPARISON:  CT of the head November 24, 2013 at 2109 hr.  FINDINGS: MRI HEAD FINDINGS (moderately motion degraded coronal T2 sequence)  No reduced diffusion to suggest acute ischemia. No susceptibility artifact to suggest hemorrhage.  The ventricles and sulci are normal for patient's age. Right mesial frontal/ cingulate gyrus encephalomalacia, axial 14/22. Associated very mild ex vacuo dilatation of the right frontal horn of the lateral ventricle. A few scattered supratentorial subcentimeter white matter FLAIR T2 hyperintensities are seen, with mild  cystic degeneration of a white matter lesion in the right frontal deep white matter. No intraparenchymal mass effect or midline shift.  No abnormal extra-axial fluid collections. Ocular globes and orbital contents are unremarkable though not tailored for evaluation. Trace paranasal sinus mucosal thickening without air-fluid levels. No suspicious calvarial bone marrow signal. No abnormal sellar expansion. No cerebellar tonsillar descent below the foramen magnum.  MRA HEAD FINDINGS  Anterior circulation: Normal flow related enhancement of the included cervical, petrous, cavernous and supra clinoid internal carotid arteries. The anterior communicating artery is not well demonstrated. Normal flow related enhancement of the anterior and middle cerebral arteries, including more distal segments.  Posterior circulation: Right vertebral artery is mildly dominant. Basilar artery is patent, with normal flow related enhancement of the main branch vessels. Partially fenestrated origin of the basilar artery. Diminutive left P1 segment with rib os compensatory contribution of from a left posterior communicating artery. Normal flow related enhancement of the posterior cerebral arteries.  No large vessel occlusion, hemodynamically significant stenosis, abnormal luminal irregularity, aneurysm within the anterior nor posterior circulation.  IMPRESSION: MRI head: No acute intracranial process, specifically no evidence of acute ischemia.  Mild white matter changes suggest chronic small ischemic disease with right cingulate gyrus/ mesial frontal lobe encephalomalacia which may reflect remote anterior cerebral artery territory infarct or, less likely remote trauma.  MRA head:  No hemodynamically significant stenosis.  Electronically Signed: By: Elon Alas On: 11/24/2013 23:09    EKG Interpretation   None       MDM   1. CVA (cerebral infarction)   2. Stroke   3. Tobacco abuse    Patient with recurrent neurologic  deficits on right side. He is near complete resolution in between. Seen immediately by neurology. Patient scores too low for TPA. Patient had an emergent MRI done to find out if the clock reset when the symptoms resolved. Neurology has been following closely. Admitted to internal medicine.    Jasper Riling. Alvino Chapel, [place] 11/25/13 [phone]

## 2013-11-25 NOTE — Progress Notes (Signed)
Patient discharged this evening. Assessments remained unchanged. D/c instructions signed and given.

## 2013-11-25 NOTE — Progress Notes (Signed)
Received report at 0044. Room ready and awaiting arrival of pt.

## 2013-11-26 NOTE — Discharge Summary (Signed)
Family Medicine Teaching Service  Discharge Note : Attending Sara Neal MD Pager [phone] Office [phone] I have seen and examined this patient, reviewed their chart and discussed discharge planning wit the resident at the time of discharge. I agree with the discharge plan as above.  

## 2013-12-17 ENCOUNTER — Other Ambulatory Visit: Payer: Self-pay | Admitting: Family Medicine

## 2013-12-22 ENCOUNTER — Encounter: Payer: Self-pay | Admitting: Podiatry

## 2013-12-28 ENCOUNTER — Other Ambulatory Visit: Payer: Self-pay | Admitting: Family Medicine

## 2014-01-04 ENCOUNTER — Encounter: Payer: Self-pay | Admitting: Podiatry

## 2014-01-04 ENCOUNTER — Ambulatory Visit (INDEPENDENT_AMBULATORY_CARE_PROVIDER_SITE_OTHER): Payer: BC Managed Care – PPO | Admitting: Podiatry

## 2014-01-04 VITALS — BP 144/92 | HR 69 | Resp 16

## 2014-01-04 DIAGNOSIS — Z472 Encounter for removal of internal fixation device: Secondary | ICD-10-CM

## 2014-01-04 NOTE — Progress Notes (Signed)
Subjective:     Patient ID: Jason Parsons, male   DOB: 09/11/1956, 58 y.o.   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 Capsular tissue was sharply dissected off the underlying bone and prominent pin was noted which was then freed from all surrounding soft tissue attachments and removed. The area was flushed in its entirety and the skin was sutured with 5-0 nylon and sterile dressing was applied. Patient's tourniquet was released and capillary refill was noted to be immediate to all digits on the left foot and patient left the OR in satisfactory condition and will be seen back 2 weeks for suture removal and less any issues should occur when he will come in immediately

## 2014-01-18 ENCOUNTER — Ambulatory Visit (INDEPENDENT_AMBULATORY_CARE_PROVIDER_SITE_OTHER): Payer: BC Managed Care – PPO | Admitting: Podiatry

## 2014-01-18 ENCOUNTER — Encounter: Payer: Self-pay | Admitting: Podiatry

## 2014-01-18 VITALS — BP 124/80 | HR 78 | Resp 16

## 2014-01-18 DIAGNOSIS — Z472 Encounter for removal of internal fixation device: Secondary | ICD-10-CM

## 2014-01-18 NOTE — Progress Notes (Signed)
Subjective:     Patient ID: Jason Parsons, male   DOB: 1956-09-22, 58 y.o.   MRN: [id number]  HPI patient states my left foot is doing well after having pin removal   Review of Systems     Objective:   Physical Exam Neurovascular status intact with incision site healing well and wound edges well coapted with stitches in place    Assessment:     Healing well from pin removal    Plan:     Stitches are removed wound edges quite well and dressing applied. Reappoint if any issues should occur

## 2014-01-30 ENCOUNTER — Ambulatory Visit (INDEPENDENT_AMBULATORY_CARE_PROVIDER_SITE_OTHER): Payer: BC Managed Care – PPO | Admitting: Physician Assistant

## 2014-01-30 VITALS — BP 126/74 | HR 62 | Temp 97.7°F | Resp 16 | Ht 69.5 in | Wt 168.0 lb

## 2014-01-30 DIAGNOSIS — B009 Herpesviral infection, unspecified: Secondary | ICD-10-CM

## 2014-01-30 MED ORDER — VALACYCLOVIR HCL 500 MG PO TABS
500.0000 mg | ORAL_TABLET | Freq: Every day | ORAL | Status: DC
Start: 2014-01-30 — End: 2014-11-22

## 2014-01-30 NOTE — Progress Notes (Signed)
   Subjective:    Patient ID: Jason Parsons, male    DOB: [birth year], 58 y.o.   MRN: [id number]  HPI   Jason Parsons is a pleasant 58 yr old male here requesting RF of Valtrex.  He takes daily for HSV2 suppression.  Tolerates this well.  He has no other concerns today and reports he is feeling well.  He identifies [facility] as PCP - last CPE "awhile" ago.  Last seen here for acute visit 1 yr ago.  On chart review pt with possible CVA in Jan 2015, no follow up since that time.  Admits that it would probably be a good idea to schedule an appt for health maintenance, etc   Review of Systems  Constitutional: Negative.   Respiratory: Negative.   Cardiovascular: Negative.   Musculoskeletal: Negative.   Skin: Negative.        Objective:   Physical Exam  Vitals reviewed. Constitutional: He is oriented to person, place, and time. He appears well-developed and well-nourished. No distress.  HENT:  Head: Normocephalic and atraumatic.  Eyes: Conjunctivae are normal. No scleral icterus.  Cardiovascular: Normal rate, regular rhythm and normal heart sounds.   Pulmonary/Chest: Effort normal and breath sounds normal. He has no wheezes. He has no rales.  Neurological: He is alert and oriented to person, place, and time.  Skin: Skin is warm and dry.  Psychiatric: He has a normal mood and affect. His behavior is normal.        Assessment & Plan:  HSV-2 (herpes simplex virus 2) infection   Jason Parsons is a 58 yr male here for refill of valtrex.  I have sent a 1 yr supply of medication for him.  I recommend that he schedule a complete physical - esp given his recent health history.  He states he previously has not had time for this but admits that it would be a good idea.  I have sent a message to the scheduling pool to get him set up for CPE sometime in the next 6 months   E. Natividad Brood MHS, PA-C [facility] 4/7/20158:11 PM

## 2014-01-30 NOTE — Patient Instructions (Signed)
Continue taking Valtrex as directed  Plan to schedule a complete physical in the next 6 months in or so - I will have our schedules give you a call to set this up

## 2014-05-12 ENCOUNTER — Ambulatory Visit (INDEPENDENT_AMBULATORY_CARE_PROVIDER_SITE_OTHER): Payer: BC Managed Care – PPO | Admitting: Emergency Medicine

## 2014-05-12 VITALS — BP 93/55 | HR 61 | Temp 98.8°F | Resp 20 | Ht 68.58 in | Wt 163.2 lb

## 2014-05-12 DIAGNOSIS — R079 Chest pain, unspecified: Secondary | ICD-10-CM

## 2014-05-12 NOTE — Progress Notes (Signed)
.   [facility] [address] [place] [address] [phone]  Date:  05/12/2014   Name:  Jason Parsons   DOB:  1956-09-08   MRN:  [id number]  PCP:  Jenny Reichmann, MD    Chief Complaint: Fever, Cough, Generalized Body Aches and Fatigue   History of Present Illness:  Jason Parsons is a 58 y.o. very pleasant male patient who presents with the following:  Had an episode of one hour duration pain n middle of chest. Non radiation or rapid or irregular heartbeat.   No diaphoresis, shortness of breath.  No nausea or vomiting.  No stool change or rash.  No neuro or other complaints.  Took a tums and it permitted his usual activities.  No improvement with over the counter medications or other home remedies. Denies other complaint or health concern today.   Says this has never occurred presviously.  Smokes.  No HGP or HLD     Patient Active Problem List   Diagnosis Date Noted  . Stroke 11/25/2013  . Tobacco abuse 11/25/2013  . CVA (cerebral infarction) 11/24/2013    No past medical history on file.  Past Surgical History  Procedure Laterality Date  . Joint replacement    . Bunionectomy    . Plantar fibroma      History  Substance Use Topics  . Smoking status: Current Every Day Smoker -- 35 years    Types: Cigarettes  . Smokeless tobacco: Never Used  . Alcohol Use: Yes    No family history on file.  No Known Allergies  Medication list has been reviewed and updated.  Current Outpatient Prescriptions on File Prior to Visit  Medication Sig Dispense Refill  . aspirin EC 325 MG EC tablet Take 1 tablet (325 mg total) by mouth daily.  30 tablet  0  . cycloSPORINE (RESTASIS) 0.05 % ophthalmic emulsion Place 1 drop into both eyes daily.      . valACYclovir (VALTREX) 500 MG tablet Take 1 tablet (500 mg total) by mouth daily.  90 tablet  3   No current facility-administered medications on file prior to visit.    Review of Systems:  As per HPI, otherwise  negative.   Physical Examination: Filed Vitals:   05/12/14 1433  BP: 93/55  Pulse: 61  Temp: 98.8 F (37.1 C)  Resp: 20   Filed Vitals:   05/12/14 1433  Height: 5' 8.58" (1.742 m)  Weight: 163 lb 4 oz (74.05 kg)   Body mass index is 24.4 kg/(m^2). Ideal Body Weight: Weight in (lb) to have BMI = 25: 166.9  GEN: WDWN, NAD, Non-toxic, A & O x 3 HEENT: Atraumatic, Normocephalic. Neck supple. No masses, No LAD. Ears and Nose: No external deformity. CV: RRR, No M/G/R. No JVD. No thrill. No extra heart sounds. PULM: CTA B, no wheezes, crackles, rhonchi. No retractions. No resp. distress. No accessory muscle use. ABD: S, NT, ND, +BS. No rebound. No HSM. EXTR: No c/c/e NEURO Normal gait.  PSYCH: Normally interactive. Conversant. Not depressed or anxious appearing.  Calm demeanor.     Assessment and Plan: Chest pain with EKG changes Offered Admit and transport Refused. Signed AMA  Signed,  Ellison Carwin, MD

## 2014-05-12 NOTE — Patient Instructions (Addendum)
Chest Pain (Nonspecific) It is often hard to give a specific diagnosis for the cause of chest pain. There is always a chance that your pain could be related to something serious, such as a heart attack or a blood clot in the lungs. You need to follow up with your health care provider for further evaluation. CAUSES   Heartburn.  Pneumonia or bronchitis.  Anxiety or stress.  Inflammation around your heart (pericarditis) or lung (pleuritis or pleurisy).  A blood clot in the lung.  A collapsed lung (pneumothorax). It can develop suddenly on its own (spontaneous pneumothorax) or from trauma to the chest.  Shingles infection (herpes zoster virus). The chest wall is composed of bones, muscles, and cartilage. Any of these can be the source of the pain.  The bones can be bruised by injury.  The muscles or cartilage can be strained by coughing or overwork.  The cartilage can be affected by inflammation and become sore (costochondritis). DIAGNOSIS  Lab tests or other studies may be needed to find the cause of your pain. Your health care provider may have you take a test called an ambulatory electrocardiogram (ECG). An ECG records your heartbeat patterns over a 24-hour period. You may also have other tests, such as:  Transthoracic echocardiogram (TTE). During echocardiography, sound waves are used to evaluate how blood flows through your heart.  Transesophageal echocardiogram (TEE).  Cardiac monitoring. This allows your health care provider to monitor your heart rate and rhythm in real time.  Holter monitor. This is a portable device that records your heartbeat and can help diagnose heart arrhythmias. It allows your health care provider to track your heart activity for several days, if needed.  Stress tests by exercise or by giving medicine that makes the heart beat faster. TREATMENT   Treatment depends on what may be causing your chest pain. Treatment may include:  Acid blockers for  heartburn.  Anti-inflammatory medicine.  Pain medicine for inflammatory conditions.  Antibiotics if an infection is present.  You may be advised to change lifestyle habits. This includes stopping smoking and avoiding alcohol, caffeine, and chocolate.  You may be advised to keep your head raised (elevated) when sleeping. This reduces the chance of acid going backward from your stomach into your esophagus. Most of the time, nonspecific chest pain will improve within 2-3 days with rest and mild pain medicine.  HOME CARE INSTRUCTIONS   If antibiotics were prescribed, take them as directed. Finish them even if you start to feel better.  For the next few days, avoid physical activities that bring on chest pain. Continue physical activities as directed.  Do not use any tobacco products, including cigarettes, chewing tobacco, or electronic cigarettes.  Avoid drinking alcohol.  Only take medicine as directed by your health care provider.  Follow your health care provider's suggestions for further testing if your chest pain does not go away.  Keep any follow-up appointments you made. If you do not go to an appointment, you could develop lasting (chronic) problems with pain. If there is any problem keeping an appointment, call to reschedule. SEEK MEDICAL CARE IF:   Your chest pain does not go away, even after treatment.  You have a rash with blisters on your chest.  You have a fever. SEEK IMMEDIATE MEDICAL CARE IF:   You have increased chest pain or pain that spreads to your arm, neck, jaw, back, or abdomen.  You have shortness of breath.  You have an increasing cough, or you cough  up blood.  You have severe back or abdominal pain.  You feel nauseous or vomit.  You have severe weakness.  You faint.  You have chills. This is an emergency. Do not wait to see if the pain will go away. Get medical help at once. Call your local emergency services (911 in [place] Do not drive  yourself to the hospital. MAKE SURE YOU:   Understand these instructions.  Will watch your condition.  Will get help right away if you are not doing well or get worse. Document Released: 07/22/2005 Document Revised: 10/17/2013 Document Reviewed: 05/17/2008 [facility] Patient Information 2015 [place], [place]. This information is not intended to replace advice given to you by your health care provider. Make sure you discuss any questions you have with your health care provider. Rejection of Medical Treatment, Against Medical Advice Medical examination, treatment, or testing has been recommended for me. I have decided to reject further treatment or medical evaluation, and will leave the facility. I am rejecting medical care of my own choice, and contrary to the instructions and wishes of __________________________________________________, the treating physician. I understand that permanent harm, or even death, can occur from failing to follow the recommendations of the physician. I have had an opportunity to ask questions and fully understand my medical condition. I have been advised that I may return at any time to continue medical evaluation or treatment.  Because I am rejecting recommended medical care, I agree to absolve and release the physician and the medical facility from any and all liabilities for damages arising from any current medical condition. I accept all risk associated with my medical condition, both known and unknown. I assume all responsibility for this action. I agree that I will make no claim of any nature against this medical facility or the physician under any circumstances. __________________________________________________ Name of Patient __________________________________________________ Signature of Patient or Guardian of Same __________________________________________________ Date Document Released: 10/12/2005 Document Revised: 01/04/2012 Document Reviewed:  05/31/2008 [employer] Patient Information 2015 [place], [place]. This information is not intended to replace advice given to you by your health care provider. Make sure you discuss any questions you have with your health care provider.

## 2014-06-18 ENCOUNTER — Encounter: Payer: Self-pay | Admitting: Family Medicine

## 2014-06-18 ENCOUNTER — Ambulatory Visit (INDEPENDENT_AMBULATORY_CARE_PROVIDER_SITE_OTHER): Payer: BC Managed Care – PPO | Admitting: Family Medicine

## 2014-06-18 VITALS — BP 122/80 | HR 76 | Temp 98.2°F | Resp 18 | Wt 158.0 lb

## 2014-06-18 DIAGNOSIS — R0989 Other specified symptoms and signs involving the circulatory and respiratory systems: Secondary | ICD-10-CM

## 2014-06-18 DIAGNOSIS — J441 Chronic obstructive pulmonary disease with (acute) exacerbation: Secondary | ICD-10-CM

## 2014-06-18 DIAGNOSIS — Z23 Encounter for immunization: Secondary | ICD-10-CM

## 2014-06-18 DIAGNOSIS — G4700 Insomnia, unspecified: Secondary | ICD-10-CM

## 2014-06-18 DIAGNOSIS — F17210 Nicotine dependence, cigarettes, uncomplicated: Secondary | ICD-10-CM

## 2014-06-18 DIAGNOSIS — R9431 Abnormal electrocardiogram [ECG] [EKG]: Secondary | ICD-10-CM

## 2014-06-18 DIAGNOSIS — Z7189 Other specified counseling: Secondary | ICD-10-CM

## 2014-06-18 DIAGNOSIS — R5381 Other malaise: Secondary | ICD-10-CM

## 2014-06-18 DIAGNOSIS — I714 Abdominal aortic aneurysm, without rupture, unspecified: Secondary | ICD-10-CM

## 2014-06-18 DIAGNOSIS — R5383 Other fatigue: Secondary | ICD-10-CM

## 2014-06-18 DIAGNOSIS — R0600 Dyspnea, unspecified: Secondary | ICD-10-CM

## 2014-06-18 DIAGNOSIS — Z7689 Persons encountering health services in other specified circumstances: Secondary | ICD-10-CM

## 2014-06-18 DIAGNOSIS — J449 Chronic obstructive pulmonary disease, unspecified: Secondary | ICD-10-CM

## 2014-06-18 DIAGNOSIS — R0609 Other forms of dyspnea: Secondary | ICD-10-CM

## 2014-06-18 DIAGNOSIS — F17200 Nicotine dependence, unspecified, uncomplicated: Secondary | ICD-10-CM

## 2014-06-18 LAB — COMPLETE METABOLIC PANEL WITH GFR
ALBUMIN: 4.3 g/dL (ref 3.5–5.2)
ALK PHOS: 75 U/L (ref 39–117)
ALT: 14 U/L (ref 0–53)
AST: 18 U/L (ref 0–37)
BILIRUBIN TOTAL: 0.8 mg/dL (ref 0.2–1.2)
BUN: 8 mg/dL (ref 6–23)
CO2: 26 mEq/L (ref 19–32)
Calcium: 9.3 mg/dL (ref 8.4–10.5)
Chloride: 105 mEq/L (ref 96–112)
Creat: 0.94 mg/dL (ref 0.50–1.35)
GFR, Est African American: >89 mL/min
GFR, Est Non African American: 89 mL/min
GLUCOSE: 87 mg/dL (ref 70–99)
Potassium: 4.5 mEq/L (ref 3.5–5.3)
SODIUM: 140 meq/L (ref 135–145)
Total Protein: 7 g/dL (ref 6.0–8.3)

## 2014-06-18 LAB — CBC WITH DIFFERENTIAL/PLATELET
BASOS ABS: 0.1 10*3/uL (ref 0.0–0.1)
Basophils Relative: 1 % (ref 0–1)
EOS ABS: 0.1 10*3/uL (ref 0.0–0.7)
EOS PCT: 2 % (ref 0–5)
HEMATOCRIT: 39.6 % (ref 39.0–52.0)
HEMOGLOBIN: 14.2 g/dL (ref 13.0–17.0)
Lymphocytes Relative: 22 % (ref 12–46)
Lymphs Abs: 1.6 10*3/uL (ref 0.7–4.0)
MCH: 35 pg — AB (ref 26.0–34.0)
MCHC: 35.9 g/dL (ref 30.0–36.0)
MCV: 97.5 fL (ref 78.0–100.0)
MONO ABS: 0.6 10*3/uL (ref 0.1–1.0)
MONOS PCT: 8 % (ref 3–12)
Neutro Abs: 5 10*3/uL (ref 1.7–7.7)
Neutrophils Relative %: 67 % (ref 43–77)
Platelets: 295 10*3/uL (ref 150–400)
RBC: 4.06 MIL/uL — ABNORMAL LOW (ref 4.22–5.81)
RDW: 14.1 % (ref 11.5–15.5)
WBC: 7.4 10*3/uL (ref 4.0–10.5)

## 2014-06-18 LAB — TSH: TSH: 1.663 u[IU]/mL (ref 0.350–4.500)

## 2014-06-18 MED ORDER — TRAZODONE HCL 50 MG PO TABS: 50.0000 mg | ORAL_TABLET | Freq: Every evening | ORAL | Status: DC | PRN

## 2014-06-18 MED ORDER — TIOTROPIUM BROMIDE MONOHYDRATE 18 MCG IN CAPS: 18.0000 ug | ORAL_CAPSULE | Freq: Every day | RESPIRATORY_TRACT | Status: DC

## 2014-06-18 NOTE — Progress Notes (Signed)
Subjective:    Patient ID: Jason Parsons, male    DOB: 1956-06-05, 58 y.o.   MRN: [id number]  06/18/2014  Shortness of Breath, Fatigue, Establish Care, Nicotine Dependence and Referral   HPI This 58 y.o. male presents to establish care.  Last physical a while ago;never? Colonoscopy 2010.   Tetanus unknown. Pneumovax never. Flu vaccine 2014. Eye exam [place]; yearly. Dental exam changing dentists.   Insomnia: no problems falling asleep; difficulties staying sleep.  Gets food cramps.  One trazodone one qhs with some improvement.  Emotionally doing fine; some stress related to new onset health problems.   Leg cramps: New.  Pain in R leg while driving.  Intermittent issue; does not occur daily. No associated lower back pain; no n/t/w.  No saddle paresthesias; no b/b dysfunction.    CVA:  10/2013; L sided weakness and numbness; now taking ASA $RemoveBe'325mg'KjZrJEHTV$  daily.  Regained full strength; no deficit.     Upon review of chart, patient suffered with R sided weakness intermittently; admitted and discharged with recommendations to undergo 2D-echo as an outpatient. There was also concern of recurrent partial seizure activity as etiology to symptoms; recommended neurology consultation with EEG as an outpatient. No apparent follow-up has been completed; pt never presented for hospital follow-up after admission.  Shortness of breath:  Onset in past several months.  Prescribed Albuterol by PA at work; [facility]; 3 months ago.  Just started using it with the heat.  Breathing is good at rest.  At night, breathing is short. Can hear wheezing regularly.  Wheezing definitely gets worse with walking.  Using rescue inhaler atleast once per day.  Sent for CXR at work; thinks CXR was fine.  +chronic smoker's cough; +sputum unknown color; no increased sputum or apparent change in color.  No chest pain, palpitations, leg swelling.  Lying down makes breathing worse.     Upon review of chart, patient presented on 05/12/14 to  office with episode of substernal chest pain.  EKG with new onset T wave inversion of inferior leads.  Pt refused transport to ED or admission and left office AMA.    Fatigue:  Drinks 2 cups of coffee in morning; drinks water during the day;wine at n[employer].    Not sleepy all the time; gets up early; has coffee; has good energy until 2:00pm; then wants to take a nap.  Emotionally OK.  Snores.  Feels refreshed.    Sleeps 4-6 hours of sleep per night.    Tobacco abuse:  Wants to quit today; has patch and gum.  Has taken Chantix in past.  +Vivid dreams on Chantix.  Realizes that smoking is now catching up with him.     Abdominal aortic aneurysm: s/p CT abd/pelvis in 06/2012 that revealed a 3cm aortic aneurysm; now presenting for follow-up; no evaluation in past by cardiology or vascular surgery.      Review of Systems  Constitutional: Negative for fever, chills, diaphoresis, activity change, appetite change and fatigue.  Eyes: Negative for visual disturbance.  Respiratory: Positive for cough, shortness of breath and wheezing. Negative for stridor.   Cardiovascular: Negative for chest pain, palpitations and leg swelling.  Gastrointestinal: Negative for nausea, vomiting, diarrhea, constipation and blood in stool.  Endocrine: Negative for cold intolerance, heat intolerance, polydipsia, polyphagia and polyuria.  Musculoskeletal: Positive for myalgias. Negative for back pain.  Neurological: Negative for dizziness, tremors, seizures, syncope, facial asymmetry, speech difficulty, weakness, light-headedness, numbness and headaches.  Psychiatric/Behavioral: Positive for sleep disturbance. Negative for  suicidal ideas and self-injury.    Past Medical History  Diagnosis Date  . Diverticulitis     with perforation; s/p colostomy with reversal.  . Abdominal aortic aneurysm 06/26/2012    3 cm distal  . Insomnia   . Genital herpes   . CVA (cerebral infarction) 10/26/2013    near syncope, blurred vision; L sided  weakness and numbness.    . Seizures    Past Surgical History  Procedure Laterality Date  . Bunionectomy    . Plantar fibroma    . Admission      L knee septic joint MRSA after steroid injection L knee.   PICC line iv abx.  . Joint replacement      L TKR. Alusio.  . Colostomy  08/26/2009    diverticulitis with perforation.  . Colostomy reversal  03/26/2010   No Known Allergies Current Outpatient Prescriptions  Medication Sig Dispense Refill  . aspirin EC 325 MG EC tablet Take 1 tablet (325 mg total) by mouth daily.  30 tablet  0  . cycloSPORINE (RESTASIS) 0.05 % ophthalmic emulsion Place 1 drop into both eyes daily.      Marland Kitchen UNABLE TO FIND Med Name: patient states he uses a cream for his face unknown name      . valACYclovir (VALTREX) 500 MG tablet Take 1 tablet (500 mg total) by mouth daily.  90 tablet  3  . tiotropium (SPIRIVA) 18 MCG inhalation capsule Place 1 capsule (18 mcg total) into inhaler and inhale daily.  30 capsule  12  . traZODone (DESYREL) 50 MG tablet Take 1-2 tablets (50-100 mg total) by mouth at bedtime as needed for sleep.  60 tablet  5   No current facility-administered medications for this visit.   History   Social History  . Marital Status: Single    Spouse Name: N/A    Number of Children: N/A  . Years of Education: N/A   Occupational History  . Not on file.   Social History Main Topics  . Smoking status: Current Every Day Smoker -- 35 years    Types: Cigarettes  . Smokeless tobacco: Never Used  . Alcohol Use: Yes  . Drug Use: No  . Sexual Activity: Yes   Other Topics Concern  . Not on file   Social History Narrative   Marital status: single;       Children:  None      Lives: alone      Employment: Warden/ranger since 2000      Tobacco: 1.5 ppd; quit several times in the past; longest 20 days; 4 days      Alcohol:  2 glasses of wine daily; same on weekends.       Drugs: none      Exercise: none   Family History  Problem  Relation Age of Onset  . Arthritis Mother        Objective:    BP 122/80  Pulse 76  Temp(Src) 98.2 F (36.8 C)  Resp 18  Wt 158 lb (71.668 kg)  SpO2 96% Physical Exam  Nursing note and vitals reviewed. Constitutional: He is oriented to person, place, and time. He appears well-developed and well-nourished. No distress.  HENT:  Head: Normocephalic and atraumatic.  Right Ear: External ear normal.  Left Ear: External ear normal.  Nose: Nose normal.  Mouth/Throat: Oropharynx is clear and moist.  Eyes: Conjunctivae and EOM are normal. Pupils are equal, round, and reactive to light.  Neck: Normal range of motion. Neck supple. Carotid bruit is not present. No thyromegaly present.  Cardiovascular: Normal rate, regular rhythm, normal heart sounds and intact distal pulses.  Exam reveals no gallop and no friction rub.   No murmur heard. Pulmonary/Chest: Effort normal and breath sounds normal. He has no wheezes. He has no rales.  Abdominal: Soft. Bowel sounds are normal. He exhibits no distension and no mass. There is no tenderness. There is no rebound and no guarding.  Lymphadenopathy:    He has no cervical adenopathy.  Neurological: He is alert and oriented to person, place, and time. No cranial nerve deficit. He exhibits normal muscle tone. Coordination normal.  Skin: Skin is warm and dry. No rash noted. He is not diaphoretic.  Psychiatric: He has a normal mood and affect. His behavior is normal. Judgment and thought content normal.   Results for orders placed in visit on 06/18/14  COMPLETE METABOLIC PANEL WITH GFR      Result Value Ref Range   Sodium 140  135 - 145 mEq/L   Potassium 4.5  3.5 - 5.3 mEq/L   Chloride 105  96 - 112 mEq/L   CO2 26  19 - 32 mEq/L   Glucose, Bld 87  70 - 99 mg/dL   BUN 8  6 - 23 mg/dL   Creat 0.94  0.50 - 1.35 mg/dL   Total Bilirubin 0.8  0.2 - 1.2 mg/dL   Alkaline Phosphatase 75  39 - 117 U/L   AST 18  0 - 37 U/L   ALT 14  0 - 53 U/L   Total Protein  7.0  6.0 - 8.3 g/dL   Albumin 4.3  3.5 - 5.2 g/dL   Calcium 9.3  8.4 - 10.5 mg/dL   GFR, Est African American >89     GFR, Est Non African American 89    CBC WITH DIFFERENTIAL      Result Value Ref Range   WBC 7.4  4.0 - 10.5 K/uL   RBC 4.06 (*) 4.22 - 5.81 MIL/uL   Hemoglobin 14.2  13.0 - 17.0 g/dL   HCT 39.6  39.0 - 52.0 %   MCV 97.5  78.0 - 100.0 fL   MCH 35.0 (*) 26.0 - 34.0 pg   MCHC 35.9  30.0 - 36.0 g/dL   RDW 14.1  11.5 - 15.5 %   Platelets 295  150 - 400 K/uL   Neutrophils Relative % 67  43 - 77 %   Neutro Abs 5.0  1.7 - 7.7 K/uL   Lymphocytes Relative 22  12 - 46 %   Lymphs Abs 1.6  0.7 - 4.0 K/uL   Monocytes Relative 8  3 - 12 %   Monocytes Absolute 0.6  0.1 - 1.0 K/uL   Eosinophils Relative 2  0 - 5 %   Eosinophils Absolute 0.1  0.0 - 0.7 K/uL   Basophils Relative 1  0 - 1 %   Basophils Absolute 0.1  0.0 - 0.1 K/uL   Smear Review Criteria for review not met    TSH      Result Value Ref Range   TSH 1.663  0.350 - 4.500 uIU/mL   EKG: NSR: T wave inversion inferior leads; new onset from 10/2013; no change from 05/12/14.  SPIROMETRY:  FVC 70%; FEV1 50%; FEV1/FVC  72%  --- MODERATE OBSTRUCTION.  PNEUMOVAX ADMINISTERED.    Assessment & Plan:   1. Cigarette nicotine dependence without complication   2. Other  malaise and fatigue   3. AAA (abdominal aortic aneurysm) without rupture   4. Dyspnea   5. COPD exacerbation   6. Chronic airway obstruction, not elsewhere classified   7. Insomnia   8. Nonspecific abnormal electrocardiogram (ECG) (EKG)   9. Need for prophylactic vaccination against Streptococcus pneumoniae (pneumococcus)   10. Establishing care with new doctor, encounter for    1. Dyspnea on exertion: New. Associated with wheezing, chronic smoking history, chronic smoker's cough.  Secondary to COPD yet will also refer to cardiology to rule out anginal equivalent due to abnormal EKG findings.  Obtain copy of recent CXR from employment. 2.  COPD:  New.   Moderate; rx for Spiriva provided to use daily; encourage smoking cessation; recommend Albuterol use PRN.  Avoid exercise until cardiology consultation. 3.  Abnormal EKG:  New in 04/2014; presented to office with chest pain and new EKG changes; refused transport and admission.  Refer to cardiology for evaluation.  To ED for onset of chest pain. 4.  Abdominal aortic aneurysm:  Present since 06/2012; warrants repeat imaging; will refer to cardiology to order appropriate imaging studies.  Recommend smoking cessation. 5.  Malaise and fatigue:  New.  Obtain labs; refer to cardiology; likely secondary to insomnia.  If no improvement with treating insomnia, consider sleep study. 6. Insomnia: improved but persistent; increase Trazodone to two tablets qhs. 7. S/p Pneumovax due to smoking. 8.  Tobacco abuse: contemplative; plans to quit today with nicotine patch and gum. 9. Establish care:  Reviewed entire health history in detail during visit.    Meds ordered this encounter  Medications  . UNABLE TO FIND    Sig: Med Name: patient states he uses a cream for his face unknown name  . DISCONTD: traZODone (DESYREL) 50 MG tablet    Sig: Take 50 mg by mouth at bedtime.  . traZODone (DESYREL) 50 MG tablet    Sig: Take 1-2 tablets (50-100 mg total) by mouth at bedtime as needed for sleep.    Dispense:  60 tablet    Refill:  5  . tiotropium (SPIRIVA) 18 MCG inhalation capsule    Sig: Place 1 capsule (18 mcg total) into inhaler and inhale daily.    Dispense:  30 capsule    Refill:  12    Return in about 3 months (around 09/18/2014) for complete physical examiniation.    Reginia Forts, M.D.  [facility] [address] [place], [place]  [address] [phone] phone [phone] fax

## 2014-06-21 ENCOUNTER — Encounter: Payer: Self-pay | Admitting: Family Medicine

## 2014-07-09 ENCOUNTER — Encounter: Payer: Self-pay | Admitting: Family Medicine

## 2014-07-10 ENCOUNTER — Encounter: Payer: Self-pay | Admitting: Family Medicine

## 2014-07-26 ENCOUNTER — Encounter: Payer: Self-pay | Admitting: Cardiology

## 2014-07-26 ENCOUNTER — Ambulatory Visit (INDEPENDENT_AMBULATORY_CARE_PROVIDER_SITE_OTHER): Payer: BC Managed Care – PPO | Admitting: Cardiology

## 2014-07-26 VITALS — BP 116/82 | HR 70 | Ht 70.0 in | Wt 155.0 lb

## 2014-07-26 DIAGNOSIS — R9431 Abnormal electrocardiogram [ECG] [EKG]: Secondary | ICD-10-CM

## 2014-07-26 DIAGNOSIS — R0609 Other forms of dyspnea: Secondary | ICD-10-CM | POA: Insufficient documentation

## 2014-07-26 DIAGNOSIS — R010 Benign and innocent cardiac murmurs: Secondary | ICD-10-CM

## 2014-07-26 DIAGNOSIS — R011 Cardiac murmur, unspecified: Secondary | ICD-10-CM

## 2014-07-26 DIAGNOSIS — R0789 Other chest pain: Secondary | ICD-10-CM

## 2014-07-26 MED ORDER — NITROGLYCERIN 0.4 MG SL SUBL: 0.4000 mg | SUBLINGUAL_TABLET | SUBLINGUAL | Status: AC | PRN

## 2014-07-26 NOTE — Patient Instructions (Signed)
Your physician recommends that you continue on your current medications as directed. Please refer to the Current Medication list given to you today.  Your physician has requested that you have an echocardiogram. Echocardiography is a painless test that uses sound waves to create images of your heart. It provides your doctor with information about the size and shape of your heart and how well your heart's chambers and valves are working. This procedure takes approximately one hour. There are no restrictions for this procedure.  Your physician has requested that you have a lexiscan myoview. For further information please visit [email or web address]. Please follow instruction sheet, as given.  Use your NTG under your tongue for recurrent chest pain. May take one tablet every 5 minutes. If you are still having discomfort after 3 tablets in 15 minutes, call 911.  Follow up as needed

## 2014-07-26 NOTE — Progress Notes (Signed)
Jason Parsons Date of Birth:  [birth year] [place] [address] [place], [place]  [address] [phone]        Fax   [phone]   History of Present Illness: This 58 year old gentleman is seen by me for the first time today.  He is seen at the request of Reginia Forts M.D. of urgent care on [address]. he is being seen because shortness of breath.  The patient has a history of emphysema.  He has been a heavy smoker.  He is trying to quit.  He smokes one pack of cigarettes a day.  His last chest x-ray in 2013 showed emphysema.  The patient is not diabetic.  He has mild hyper lipidemia with LDL cholesterol of 113 in January 2015. He was seen at urgent care in August 2015 with chest pressure or and questionable arm radiation.  He had some nonspecific T wave changes on his EKG in the inferior leads and he was advised to go to the hospital.  However, he refused hospitalization.  He has had no further episodes of chest discomfort.  He continues to have exertional dyspnea walking up stairs and walking uphill. He has a past history of having had a stroke in 2010 and was hospitalized in [place].  He was on blood thinner for a while and then switched to aspirin. The patient has a history of having had a heart murmur since childhood. Family history reveals that his mother is living at age 16 and has arthritis but no heart problems. The patient's father died at age 47 of complications of rheumatic fever, possibly endocarditis. Social history reveals that the patient works as a Hotel manager for [employer].  He does a lot of traveling in the car to [place].  Current Outpatient Prescriptions  Medication Sig Dispense Refill  . aspirin EC 325 MG EC tablet Take 1 tablet (325 mg total) by mouth daily.  30 tablet  0  . cycloSPORINE (RESTASIS) 0.05 % ophthalmic emulsion Place 1 drop into both eyes daily.      Marland Kitchen tiotropium (SPIRIVA) 18 MCG inhalation capsule Place 1  capsule (18 mcg total) into inhaler and inhale daily.  30 capsule  12  . traZODone (DESYREL) 50 MG tablet Take 1-2 tablets (50-100 mg total) by mouth at bedtime as needed for sleep.  60 tablet  5  . UNABLE TO FIND Med Name: patient states he uses a cream for his face unknown name      . valACYclovir (VALTREX) 500 MG tablet Take 1 tablet (500 mg total) by mouth daily.  90 tablet  3   No current facility-administered medications for this visit.    No Known Allergies  Patient Active Problem List   Diagnosis Date Noted  . DOE (dyspnea on exertion) 07/26/2014  . Pressure in chest 07/26/2014  . Abnormal EKG 07/26/2014  . Heart murmur previously undiagnosed 07/26/2014  . Stroke 11/25/2013  . Tobacco abuse 11/25/2013  . CVA (cerebral infarction) 11/24/2013    History  Smoking status  . Current Every Day Smoker -- 35 years  . Types: Cigarettes  Smokeless tobacco  . Never Used    History  Alcohol Use  . Yes    Family History  Problem Relation Age of Onset  . Arthritis Mother     Review of Systems: Constitutional: no fever chills diaphoresis or fatigue or change in weight.  Head and neck: no hearing loss, no epistaxis, no photophobia or  visual disturbance. Respiratory: No cough, positive for exertional dyspnea. Cardiovascular: No chest pain peripheral edema, palpitations. Gastrointestinal: No abdominal distention, no abdominal pain, no change in bowel habits hematochezia or melena. Genitourinary: No dysuria, no frequency, no urgency, no nocturia. Musculoskeletal:No arthralgias, no back pain, no gait disturbance or myalgias. Neurological: No dizziness, no headaches, no numbness, no seizures, no syncope, no weakness, no tremors. Hematologic: No lymphadenopathy, no easy bruising. Psychiatric: No confusion, no hallucinations, no sleep disturbance.    Physical Exam: Filed Vitals:   07/26/14 0811  BP: 116/82  Pulse: 70  The patient appears to be in no distress.  Head and  neck exam reveals that the pupils are equal and reactive.  The extraocular movements are full.  There is no scleral icterus.  Mouth and pharynx are benign.  No lymphadenopathy.  No carotid bruits.  The jugular venous pressure is normal.  Thyroid is not enlarged or tender.  Chest is clear to percussion and auscultation.  No rales or rhonchi.  Expansion of the chest is symmetrical.  Heart reveals no abnormal lift or heave.  First and second heart sounds are normal.  There is grade 2-3/6 apical holosystolic murmur suggestive of mitral regurgitation.  No gallop or rub  The abdomen is soft and nontender.  Bowel sounds are normoactive.  There is no hepatosplenomegaly or mass.  There are no abdominal bruits.  Extremities reveal no phlebitis or edema.  Pedal pulses are good.  There is no cyanosis or clubbing.  Neurologic exam is normal strength and no lateralizing weakness.  No sensory deficits.  Integument reveals no rash  EKG from 06/18/14 was reviewed and shows nonspecific inferior wall T-wave inversion .  Assessment / Plan: 1. abnormal EKG 2. exertional dyspnea and a past history of chest pressure (none since 06/18/14) 3. history of COPD and ongoing cigarette abuse 4. heart murmur suggestive of mitral regurgitation 5. past history of stroke 2010 6. history of small abdominal aortic aneurysm.  Plan: The patient will continue current medication.  We gave him a prescription for sublingual nitroglycerin to have on hand. The patient will return for a two-dimensional echocardiogram to evaluate his heart murmur. The patient will return for a LexiScan Myoview stress test.  Many thanks for the opportunity to see this pleasant gentleman.  I will be in touch regarding the results of his studies.  I encouraged him in his efforts to quit smoking altogether.

## 2014-08-02 ENCOUNTER — Ambulatory Visit (HOSPITAL_BASED_OUTPATIENT_CLINIC_OR_DEPARTMENT_OTHER): Payer: BC Managed Care – PPO

## 2014-08-02 ENCOUNTER — Ambulatory Visit (HOSPITAL_COMMUNITY): Payer: BC Managed Care – PPO | Attending: Cardiology | Admitting: Radiology

## 2014-08-02 VITALS — BP 122/87 | Ht 70.0 in | Wt 155.0 lb

## 2014-08-02 DIAGNOSIS — R011 Cardiac murmur, unspecified: Secondary | ICD-10-CM

## 2014-08-02 DIAGNOSIS — I714 Abdominal aortic aneurysm, without rupture: Secondary | ICD-10-CM | POA: Insufficient documentation

## 2014-08-02 DIAGNOSIS — R079 Chest pain, unspecified: Secondary | ICD-10-CM | POA: Diagnosis present

## 2014-08-02 DIAGNOSIS — R9431 Abnormal electrocardiogram [ECG] [EKG]: Secondary | ICD-10-CM

## 2014-08-02 DIAGNOSIS — R0602 Shortness of breath: Secondary | ICD-10-CM | POA: Insufficient documentation

## 2014-08-02 DIAGNOSIS — R0789 Other chest pain: Secondary | ICD-10-CM

## 2014-08-02 DIAGNOSIS — R0609 Other forms of dyspnea: Principal | ICD-10-CM

## 2014-08-02 MED ORDER — REGADENOSON 0.4 MG/5ML IV SOLN
0.4000 mg | Freq: Once | INTRAVENOUS | Status: AC
  Administered 2014-08-02: 0.4 mg via INTRAVENOUS

## 2014-08-02 MED ORDER — TECHNETIUM TC 99M SESTAMIBI GENERIC - CARDIOLITE
30.0000 | Freq: Once | INTRAVENOUS | Status: AC | PRN
  Administered 2014-08-02: 30 via INTRAVENOUS

## 2014-08-02 MED ORDER — TECHNETIUM TC 99M SESTAMIBI GENERIC - CARDIOLITE
10.0000 | Freq: Once | INTRAVENOUS | Status: AC | PRN
  Administered 2014-08-02: 10 via INTRAVENOUS

## 2014-08-02 NOTE — Progress Notes (Signed)
[facility] [address] [place], [place] [address] [phone]    Cardiology Nuclear Med Study  Jason Parsons is a 58 y.o. male     MRN : [id number]     DOB: 29-Sep-1956  Procedure Date: 08/02/2014  Nuclear Med Background Indication for Stress Test:  Evaluation for Ischemia and Abnormal EKG History:  Emphysema and Abnormal EKG, AAA,  Cardiac Risk Factors: Carotid Disease and CVA  Symptoms:  Chest Pain, DOE and SOB   Nuclear Pre-Procedure Caffeine/Decaff Intake:  None NPO After: 7:00pm   Lungs:  clear O2 Sat: 99% on room air. IV 0.9% NS with Angio Cath:  22g  IV Site: R Antecubital  IV Started by:  Crissie Figures, RN  Chest Size (in):  42 Cup Size: n/a  Height: 5\' 10"  (1.778 m)  Weight:  155 lb (70.308 kg)  BMI:  Body mass index is 22.24 kg/(m^2). Tech Comments:  N/A    Nuclear Med Study 1 or 2 day study: 1 day  Stress Test Type:  Lexiscan  Reading MD: N/A  Order Authorizing Provider:  Darlin Coco, MD  Resting Radionuclide: Technetium 61m Sestamibi  Resting Radionuclide Dose: 11.0 mCi   Stress Radionuclide:  Technetium 19m Sestamibi  Stress Radionuclide Dose: 33.0 mCi           Stress Protocol Rest HR: 67 Stress HR: 87  Rest BP: 122/87 Stress BP: 126/81  Exercise Time (min): n/a METS: n/a   Predicted Max HR: 162 bpm % Max HR: 53.7 bpm Rate Pressure Product: 10962   Dose of Adenosine (mg):  n/a Dose of Lexiscan: 0.4 mg  Dose of Atropine (mg): n/a Dose of Dobutamine: n/a mcg/kg/min (at max HR)  Stress Test Technologist: Perrin Maltese, EMT-P  Nuclear Technologist:  Vedia Pereyra, CNMT     Rest Procedure:  Myocardial perfusion imaging was performed at rest 45 minutes following the intravenous administration of Technetium 51m Sestamibi. Rest ECG: NSR with non-specific ST-T wave changes  Stress Procedure:  The patient received IV Lexiscan 0.4 mg over 15-seconds.  Technetium 70m Sestamibi injected at 30-seconds. This patient had  Sob and felt weird with the Lexiscan injection. Quantitative spect images were obtained after a 45 minute delay. Stress ECG: No significant change from baseline ECG  QPS Raw Data Images:  Normal; no motion artifact; normal heart/lung ratio. Stress Images:  The LV is markedly dilated.  There is a large, severe defect in the inferior and lateral walls  Rest Images:  The LV is markedly dilated.  There is a large, severe defect in the inferior and lateral walls Subtraction (SDS):  No evidence of ischemia. Transient Ischemic Dilatation (Normal <1.22):  1.04 Lung/Heart Ratio (Normal <0.45):  0.34  Quantitative Gated Spect Images QGS EDV:  373 ml QGS ESV:  296 ml  Impression Exercise Capacity:  Lexiscan with no exercise. BP Response:  Normal blood pressure response. Clinical Symptoms:  No significant symptoms noted. ECG Impression:  No significant ST segment change suggestive of ischemia. Comparison with Prior Nuclear Study: No images to compare  Overall Impression:  High risk stress nuclear study .  There is evidence of a large inferior and lateral MI.  The LV is markedly dilated. .  LV Ejection Fraction: 20%.  LV Wall Motion:  The LV is markedly dilated.  There is akinesis of the lateral wall and inferior wall.    Thayer Headings, Brooke Bonito., MD, [facility] 08/02/2014, 5:15 PM [address]  [address]  Office - [phone] Pager [phone]

## 2014-08-02 NOTE — Progress Notes (Signed)
2D Echo completed. 08/02/2014  

## 2014-08-03 ENCOUNTER — Telehealth: Payer: Self-pay | Admitting: *Deleted

## 2014-08-03 DIAGNOSIS — R0609 Other forms of dyspnea: Principal | ICD-10-CM

## 2014-08-03 MED ORDER — FUROSEMIDE 20 MG PO TABS: 20.0000 mg | ORAL_TABLET | Freq: Every day | ORAL | Status: DC

## 2014-08-03 NOTE — Telephone Encounter (Signed)
Message copied by Earvin Hansen on Fri Aug 03, 2014  5:28 PM ------      Message from: Darlin Coco      Created: Fri Aug 03, 2014 12:08 PM       Both his echo and his myoview show severe LV systolic dysfunction. Myoview suggests old inferolateral MI as the etiology. I would like him to start carvedilol 3.125 mg BID and start lisinopril 5 mg daily. When we see him Monday we will discuss right and left heart cath. His recent renal function was normal. ------

## 2014-08-03 NOTE — Telephone Encounter (Signed)
Since patient out of town discussed further with Dr Ron Parker and will just have patient start Lasix 20 mg daily for now and discuss further at [facility] Monday. patient will go Monday morning for xray

## 2014-08-03 NOTE — Telephone Encounter (Signed)
Message copied by Earvin Hansen on Fri Aug 03, 2014  5:25 PM ------      Message from: Darlin Coco      Created: Fri Aug 03, 2014 12:18 PM       See note re myoview.  Would get a chest xray in the next day or two so we have the results when we see him Monday. He has both systolic and diastolic dysfunction. Also severe MR and pulmonary hypertension.  It is imperative that he stop smoking. Also add low dose lasix 20 mg daily. ------

## 2014-08-03 NOTE — Telephone Encounter (Signed)
Message copied by Earvin Hansen on Fri Aug 03, 2014  5:27 PM ------      Message from: Darlin Coco      Created: Fri Aug 03, 2014 12:18 PM       See note re myoview.  Would get a chest xray in the next day or two so we have the results when we see him Monday. He has both systolic and diastolic dysfunction. Also severe MR and pulmonary hypertension.  It is imperative that he stop smoking. Also add low dose lasix 20 mg daily. ------

## 2014-08-06 ENCOUNTER — Ambulatory Visit (INDEPENDENT_AMBULATORY_CARE_PROVIDER_SITE_OTHER): Payer: BC Managed Care – PPO | Admitting: Cardiology

## 2014-08-06 ENCOUNTER — Encounter: Payer: BC Managed Care – PPO | Admitting: Family Medicine

## 2014-08-06 ENCOUNTER — Ambulatory Visit
Admission: RE | Admit: 2014-08-06 | Discharge: 2014-08-06 | Disposition: A | Discharge status: Home / Self Care | Payer: BC Managed Care – PPO | Source: Ambulatory Visit | Attending: Cardiology | Admitting: Cardiology

## 2014-08-06 ENCOUNTER — Encounter: Payer: Self-pay | Admitting: Cardiology

## 2014-08-06 VITALS — BP 114/60 | HR 70 | Ht 69.0 in | Wt 154.4 lb

## 2014-08-06 DIAGNOSIS — R0609 Other forms of dyspnea: Principal | ICD-10-CM

## 2014-08-06 DIAGNOSIS — R931 Abnormal findings on diagnostic imaging of heart and coronary circulation: Secondary | ICD-10-CM

## 2014-08-06 DIAGNOSIS — R011 Cardiac murmur, unspecified: Secondary | ICD-10-CM

## 2014-08-06 DIAGNOSIS — I255 Ischemic cardiomyopathy: Secondary | ICD-10-CM | POA: Insufficient documentation

## 2014-08-06 DIAGNOSIS — R010 Benign and innocent cardiac murmurs: Secondary | ICD-10-CM

## 2014-08-06 DIAGNOSIS — R9439 Abnormal result of other cardiovascular function study: Secondary | ICD-10-CM | POA: Insufficient documentation

## 2014-08-06 LAB — PROTIME-INR
INR: 1.1 ratio — ABNORMAL HIGH (ref 0.8–1.0)
Prothrombin Time: 11.7 s (ref 9.6–13.1)

## 2014-08-06 LAB — CBC WITH DIFFERENTIAL/PLATELET
Basophils Absolute: 0.1 10*3/uL (ref 0.0–0.1)
Basophils Relative: 0.7 % (ref 0.0–3.0)
EOS ABS: 0.2 10*3/uL (ref 0.0–0.7)
EOS PCT: 2.5 % (ref 0.0–5.0)
HEMATOCRIT: 48 % (ref 39.0–52.0)
Hemoglobin: 15.9 g/dL (ref 13.0–17.0)
LYMPHS ABS: 2.2 10*3/uL (ref 0.7–4.0)
Lymphocytes Relative: 28.9 % (ref 12.0–46.0)
MCHC: 33.2 g/dL (ref 30.0–36.0)
MCV: 105.1 fl — AB (ref 78.0–100.0)
Monocytes Absolute: 0.6 10*3/uL (ref 0.1–1.0)
Monocytes Relative: 8.2 % (ref 3.0–12.0)
Neutro Abs: 4.6 10*3/uL (ref 1.4–7.7)
Neutrophils Relative %: 59.7 % (ref 43.0–77.0)
Platelets: 218 10*3/uL (ref 150.0–400.0)
RBC: 4.57 Mil/uL (ref 4.22–5.81)
RDW: 13.8 % (ref 11.5–15.5)
WBC: 7.8 10*3/uL (ref 4.0–10.5)

## 2014-08-06 LAB — BASIC METABOLIC PANEL
BUN: 15 mg/dL (ref 6–23)
CALCIUM: 9.4 mg/dL (ref 8.4–10.5)
CO2: 26 mEq/L (ref 19–32)
Chloride: 98 mEq/L (ref 96–112)
Creatinine, Ser: 1.1 mg/dL (ref 0.4–1.5)
GFR: 77.04 mL/min (ref >60.00)
GLUCOSE: 77 mg/dL (ref 70–99)
Potassium: 4 mEq/L (ref 3.5–5.1)
Sodium: 134 mEq/L — ABNORMAL LOW (ref 135–145)

## 2014-08-06 LAB — APTT: APTT: 29.6 s (ref 23.4–32.7)

## 2014-08-06 MED ORDER — CARVEDILOL 3.125 MG PO TABS: 3.1250 mg | ORAL_TABLET | Freq: Two times a day (BID) | ORAL | Status: DC

## 2014-08-06 NOTE — Progress Notes (Signed)
History and physical  Jason Parsons Date of Birth:  1956/02/25 [place] [address] [place], [place]  [address] [phone]        Fax   [phone]   History of Present Illness: This pleasant 58 year old gentleman is seen back for a followup office visit following his recent abnormal Myoview stress test.  On 08/03/2014 the patient had a Myoview stress test showing a large inferolateral scar with no reversible ischemia and with an ejection fraction of 20%.  The left ventricle was dilated.  On 08/02/14 the patient had an echocardiogram which showed an ejection fraction of 20-25% with wall motion abnormalities in the inferior and lateral wall and there was grade 3 diastolic dysfunction.  There was severe mitral regurgitation and his pulmonary artery pressure was 66 mmHg.  We had initially seen him in consultation for Dr. Windell Moment.  History is as follows:This 58 year old gentleman is seen at the request of Reginia Forts M.D. of urgent care on [address] he is being seen because shortness of breath.  He denies any orthopnea or paroxysmal nocturnal dyspnea.  No peripheral edema.  The patient has not noticed any significant increase in urine output since we put him on low-dose Lasix over the past weekend. The patient has a history of emphysema. He has been a heavy smoker. He is trying to quit. He smokes one pack of cigarettes a day. His last chest x-ray in 2013 showed emphysema. The patient is not diabetic. He has mild hyper lipidemia with LDL cholesterol of 113 in January 2015.  He was seen at urgent care in August 2015 with chest pressure or and questionable arm radiation. He had some nonspecific T wave changes on his EKG in the inferior leads and he was advised to go to the hospital. However, he refused hospitalization. He has had no further episodes of chest discomfort. He continues to have exertional dyspnea walking up stairs and walking uphill.  He has a past history of  having had a stroke in 2010 and was hospitalized in [place]. He was on blood thinner for a while and then switched to aspirin.  The patient has a history of having had a heart murmur since childhood.  Family history reveals that his mother is living at age 59 and has arthritis but no heart problems.  The patient's father died at age 26 of complications of rheumatic fever, possibly endocarditis.  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  Current Outpatient Prescriptions  Medication Sig Dispense Refill  . aspirin EC 325 MG EC tablet Take 1 tablet (325 mg total) by mouth daily.  30 tablet  0  . cycloSPORINE (RESTASIS) 0.05 % ophthalmic emulsion Place 1 drop into both eyes daily.      . furosemide (LASIX) 20 MG tablet Take 1 tablet (20 mg total) by mouth daily.  30 tablet  2  . nitroGLYCERIN (NITROSTAT) 0.4 MG SL tablet Place 1 tablet (0.4 mg total) under the tongue every 5 (five) minutes as needed for chest pain.  25 tablet  prn  . tiotropium (SPIRIVA) 18 MCG inhalation capsule Place 1 capsule (18 mcg total) into inhaler and inhale daily.  30 capsule  12  . traZODone (DESYREL) 50 MG tablet Take 1-2 tablets (50-100 mg total) by mouth at bedtime as needed for sleep.  60 tablet  5  . UNABLE TO FIND Med  Name: patient states he uses a cream for his face unknown name      . valACYclovir (VALTREX) 500 MG tablet Take 1 tablet (500 mg total) by mouth daily.  90 tablet  3  . carvedilol (COREG) 3.125 MG tablet Take 1 tablet (3.125 mg total) by mouth 2 (two) times daily.  60 tablet  5   No current facility-administered medications for this visit.    No Known Allergies  Patient Active Problem List   Diagnosis Date Noted  . Abnormal nuclear stress test 08/06/2014  . Cardiomyopathy, ischemic 08/06/2014  . DOE (dyspnea on exertion) 07/26/2014  . Pressure in chest 07/26/2014  . Abnormal EKG  07/26/2014  . Heart murmur previously undiagnosed 07/26/2014  . Stroke 11/25/2013  . Tobacco abuse 11/25/2013  . CVA (cerebral infarction) 11/24/2013    History  Smoking status  . Current Every Day Smoker -- 35 years  . Types: Cigarettes  Smokeless tobacco  . Never Used    History  Alcohol Use  . Yes    Family History  Problem Relation Age of Onset  . Arthritis Mother     Review of Systems: Constitutional: no fever chills diaphoresis or fatigue or change in weight.  Head and neck: no hearing loss, no epistaxis, no photophobia or visual disturbance. Respiratory: No cough, shortness of breath or wheezing. Cardiovascular: No chest pain peripheral edema, palpitations. Gastrointestinal: No abdominal distention, no abdominal pain, no change in bowel habits hematochezia or melena. Genitourinary: No dysuria, no frequency, no urgency, no nocturia. Musculoskeletal:No arthralgias, no back pain, no gait disturbance or myalgias. Neurological: No dizziness, no headaches, no numbness, no seizures, no syncope, no weakness, no tremors.  Prior history of stroke Hematologic: No lymphadenopathy, no easy bruising. Psychiatric: No confusion, no hallucinations, no sleep disturbance.    Physical Exam: Filed Vitals:   08/06/14 1431  BP: 114/60  Pulse: 70  The patient appears to be in no distress.  Head and neck exam reveals that the pupils are equal and reactive.  The extraocular movements are full.  There is no scleral icterus.  Mouth and pharynx are benign.  No lymphadenopathy.  No carotid bruits.  The jugular venous pressure is normal.  Thyroid is not enlarged or tender.  Chest is clear to percussion and auscultation.  No rales or rhonchi.  Expansion of the chest is symmetrical.  Heart reveals no abnormal lift or heave.  First and second heart sounds are normal.  There is a grade 1/6 apical systolic murmur.  The abdomen is soft and nontender.  Bowel sounds are normoactive.  There is no  hepatosplenomegaly or mass.  There are no abdominal bruits.  There are old abdominal scar secondary to previously reversed colostomy.  Extremities reveal no phlebitis or edema.  Pedal pulses are good.  There is no cyanosis or clubbing.  Neurologic exam is normal strength and no lateralizing weakness.  No sensory deficits.  Integument reveals no rash  EKG shows normal sinus rhythm with inferior wall T-wave inversion increased since 06/18/14  Assessment / Plan: 1. ischemic cardiomyopathy with ejection fraction 20-25% by echo and Myoview 2. 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 severe mitral regurgitation by echo 4. 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 exertional dyspnea secondary to all of the above 6. history of remote stroke 2010  Disposition: We will arrange for right and left heart cardiac catheterization on Wednesday, August 08, 2014. Continue low-dose Lasix 20 mg daily.  We will  add carvedilol 3.125 mg one twice a day After cardiac catheterization we will want to add ACE inhibitor or an ARB. We will obtain preoperative labs today and he will also get a chest x-ray today. The risks and benefits of a cardiac catheterization including, but not limited to, death, stroke, MI, kidney damage and bleeding were discussed with the patient who indicates understanding and agrees to proceed.

## 2014-08-06 NOTE — Patient Instructions (Addendum)
Will obtain labs today and call you with the results (bmet/cbc/pt/inr/ptt)  Your physician recommends that you schedule a follow-up appointment in: 1 month ON 09/06/14 AT 10:15 AM   START CARVEDILOL 3.125 MG TWICE DAILY, RX SENT TO [facility]   You are scheduled for a cardiac catheterization on 08/08/14 with Dr. Irish Lack  Go to [facility] on 08/08/14 at 6:30 AM  Enter thru the [facility] entrance A No food or drink after midnight on 08/07/14 You may take your medications with a sip of water on the day of your procedure.  ONLY TAKE ASPIRIN AND CARVEDILOL DAY OF PROCEDURE  You will need someone to drive you and stay with you for 24 hours after procedure.   A chest x-ray takes a picture of the organs and structures inside the chest, including the heart, lungs, and blood vessels. This test can show several things, including, whether the heart is enlarges; whether fluid is building up in the lungs; and whether pacemaker / defibrillator leads are still in place. [place]

## 2014-08-07 ENCOUNTER — Encounter (HOSPITAL_COMMUNITY): Payer: Self-pay | Admitting: Pharmacy Technician

## 2014-08-08 ENCOUNTER — Encounter (HOSPITAL_COMMUNITY)
Admission: RE | Disposition: A | Discharge status: Home / Self Care | Payer: BC Managed Care – PPO | Source: Ambulatory Visit | Attending: Interventional Cardiology

## 2014-08-08 ENCOUNTER — Ambulatory Visit (HOSPITAL_COMMUNITY)
Admission: RE | Admit: 2014-08-08 | Discharge: 2014-08-08 | Disposition: A | Discharge status: Home / Self Care | Payer: BC Managed Care – PPO | Source: Ambulatory Visit | Attending: Interventional Cardiology | Admitting: Interventional Cardiology

## 2014-08-08 DIAGNOSIS — Z8673 Personal history of transient ischemic attack (TIA), and cerebral infarction without residual deficits: Secondary | ICD-10-CM | POA: Insufficient documentation

## 2014-08-08 DIAGNOSIS — E785 Hyperlipidemia, unspecified: Secondary | ICD-10-CM | POA: Diagnosis not present

## 2014-08-08 DIAGNOSIS — Z720 Tobacco use: Secondary | ICD-10-CM | POA: Insufficient documentation

## 2014-08-08 DIAGNOSIS — I2510 Atherosclerotic heart disease of native coronary artery without angina pectoris: Secondary | ICD-10-CM | POA: Insufficient documentation

## 2014-08-08 DIAGNOSIS — R9439 Abnormal result of other cardiovascular function study: Secondary | ICD-10-CM

## 2014-08-08 DIAGNOSIS — J439 Emphysema, unspecified: Secondary | ICD-10-CM | POA: Diagnosis not present

## 2014-08-08 DIAGNOSIS — Z7982 Long term (current) use of aspirin: Secondary | ICD-10-CM | POA: Diagnosis not present

## 2014-08-08 DIAGNOSIS — R0609 Other forms of dyspnea: Secondary | ICD-10-CM

## 2014-08-08 DIAGNOSIS — I255 Ischemic cardiomyopathy: Secondary | ICD-10-CM | POA: Insufficient documentation

## 2014-08-08 DIAGNOSIS — I340 Nonrheumatic mitral (valve) insufficiency: Secondary | ICD-10-CM | POA: Diagnosis not present

## 2014-08-08 DIAGNOSIS — R011 Cardiac murmur, unspecified: Secondary | ICD-10-CM | POA: Diagnosis not present

## 2014-08-08 DIAGNOSIS — I272 Other secondary pulmonary hypertension: Secondary | ICD-10-CM | POA: Diagnosis not present

## 2014-08-08 HISTORY — PX: LEFT AND RIGHT HEART CATHETERIZATION WITH CORONARY ANGIOGRAM: SHX5449

## 2014-08-08 LAB — POCT I-STAT 3, ART BLOOD GAS (G3+)
BICARBONATE: 24.2 meq/L — AB (ref 20.0–24.0)
O2 Saturation: 93 %
TCO2: 25 mmol/L (ref 0–100)
pCO2 arterial: 36.9 mmHg (ref 35.0–45.0)
pH, Arterial: 7.425 (ref 7.350–7.450)
pO2, Arterial: 66 mmHg — ABNORMAL LOW (ref 80.0–100.0)

## 2014-08-08 LAB — POCT I-STAT 3, VENOUS BLOOD GAS (G3P V)
Acid-Base Excess: 1 mmol/L (ref 0.0–2.0)
Bicarbonate: 27 meq/L — ABNORMAL HIGH (ref 20.0–24.0)
O2 Saturation: 60 %
TCO2: 28 mmol/L (ref 0–100)
pCO2, Ven: 45 mmHg (ref 45.0–50.0)
pH, Ven: 7.387 — ABNORMAL HIGH (ref 7.250–7.300)
pO2, Ven: 32 mmHg (ref 30.0–45.0)

## 2014-08-08 SURGERY — LEFT AND RIGHT HEART CATHETERIZATION WITH CORONARY ANGIOGRAM
Anesthesia: LOCAL

## 2014-08-08 MED ORDER — SODIUM CHLORIDE 0.9 % IJ SOLN: 3.0000 mL | Freq: Two times a day (BID) | INTRAMUSCULAR | Status: DC

## 2014-08-08 MED ORDER — MIDAZOLAM HCL 2 MG/2ML IJ SOLN
INTRAMUSCULAR | Status: AC
  Filled 2014-08-08: qty 2

## 2014-08-08 MED ORDER — SODIUM CHLORIDE 0.9 % IV SOLN: 250.0000 mL | INTRAVENOUS | Status: DC | PRN

## 2014-08-08 MED ORDER — SODIUM CHLORIDE 0.9 % IV SOLN
INTRAVENOUS | Status: DC
  Administered 2014-08-08: 1000 mL via INTRAVENOUS

## 2014-08-08 MED ORDER — FENTANYL CITRATE 0.05 MG/ML IJ SOLN
INTRAMUSCULAR | Status: AC
  Filled 2014-08-08: qty 2

## 2014-08-08 MED ORDER — VERAPAMIL HCL 2.5 MG/ML IV SOLN
INTRAVENOUS | Status: AC
  Filled 2014-08-08: qty 2

## 2014-08-08 MED ORDER — NITROGLYCERIN 1 MG/10 ML FOR IR/CATH LAB
INTRA_ARTERIAL | Status: AC
  Filled 2014-08-08: qty 10

## 2014-08-08 MED ORDER — ASPIRIN 81 MG PO CHEW: 81.0000 mg | CHEWABLE_TABLET | ORAL | Status: DC

## 2014-08-08 MED ORDER — LIDOCAINE HCL (PF) 1 % IJ SOLN
INTRAMUSCULAR | Status: AC
  Filled 2014-08-08: qty 30

## 2014-08-08 MED ORDER — HEPARIN (PORCINE) IN NACL 2-0.9 UNIT/ML-% IJ SOLN
INTRAMUSCULAR | Status: AC
  Filled 2014-08-08: qty 1500

## 2014-08-08 MED ORDER — SODIUM CHLORIDE 0.9 % IV SOLN: 1.0000 mL/kg/h | INTRAVENOUS | Status: AC

## 2014-08-08 MED ORDER — SODIUM CHLORIDE 0.9 % IJ SOLN: 3.0000 mL | INTRAMUSCULAR | Status: DC | PRN

## 2014-08-08 MED ORDER — HEPARIN SODIUM (PORCINE) 1000 UNIT/ML IJ SOLN
INTRAMUSCULAR | Status: AC
  Filled 2014-08-08: qty 1

## 2014-08-08 NOTE — Progress Notes (Signed)
Site area: right brachial  Site Prior to Removal:  Level 0  Pressure Applied For 14 MINUTES    Minutes Beginning at 1025  Manual:   Yes.    Patient Status During Pull:  stable  Post Pull Brachial Site:  Level 0  Post Pull Instructions Given:  Yes.    Post Pull Pulses Present:  Yes.    Dressing Applied:  Yes.     Bedrest Begins:n/a  Comments:  Pt tolerated brachial venous catheter pull well. VSS. Instructions given, pt verbalized understanding

## 2014-08-08 NOTE — CV Procedure (Addendum)
       PROCEDURE:  Right heart cath.  Left heart catheterization with selective coronary angiography, left ventriculogram.  INDICATIONS:  Cardiomyopathy  The risks, benefits, and details of the procedure were explained to the patient.  The patient verbalized understanding and wanted to proceed.  Informed written consent was obtained.  PROCEDURE TECHNIQUE:  After Xylocaine anesthesia a 5 French brachial sheath was exchanged for a peripheral IV in the antecubital area and a 71F slender sheath was placed in the right radial artery with a single anterior needle wall stick.  A 5 French Swan-Ganz catheter was advanced to the pulmonary artery. Oxygen saturations were performed. Hemodynamic pressure assessment was performed. Right coronary angiography was done using a Judkins R4 guide catheter.  Left coronary angiography was done using a Judkins L3.5 guide catheter.  Left ventriculography was done using a pigtail catheter.  A TR band was used for hemostasis.  Manual compression was for hemostasis of the brachial sheath.   CONTRAST:  Total of 85 cc.  COMPLICATIONS:  None.    HEMODYNAMICS:  Aortic pressure was 96/60; LV pressure was 96/12; LVEDP 15.  There was no gradient between the left ventricle and aorta.  Radiation pressure 14/11, mean right atrial pressure 9 mmHg; RV pressure 47 or 5, RVEDP 8 mmHg; pulmonary artery pressure 44/25, mean pulmonary artery pressure 34 mmHg, pulmonary We wedge pressure 21/31, mean pulmonary capillary wedge pressure 22 mmHg. Pulmonary artery saturation 60%. Aortic saturation 93%. Cardiac output 3.45 L per minute. Cardiac index 1.86.  ANGIOGRAPHIC DATA:   The left main coronary artery is widely patent.  The left anterior descending artery is a large vessel which wraps around the apex. There are collaterals to the distal RCA system. There is a large diagonal which is widely patent. There is mild atherosclerosis in the LAD.  The left circumflex artery is a large vessel  proximally. After a small second obtuse marginal, the circumflex is occluded. There are left to left collaterals feeding the distal circumflex system..  The right coronary artery is a large vessel. The mid vessel is occluded. There are some bridging collaterals that fill the distal vessel faintly.  LEFT VENTRICULOGRAM:  Left ventricular angiogram was done in the 30 RAO projection and revealed severely decreased left ventricular systolic function with an estimated ejection fraction of 20-25 %.  There is preserved contraction of the apex and anterior wall. There is severe hypokinesis of the inferior wall. LVEDP was 15  mmHg.  IMPRESSIONS:  1.  Widely patent  left main coronary artery. 2.  Mild disease in the left anterior descending artery and its branches. 3.  Occluded mid  left circumflex artery.  The distal circumflex feeds from left to left collaterals.  4.  Occluded mid right coronary artery.  The distal RCA feeds from bridging collaterals as well as left to right collaterals.  5.  Severely decreased  left ventricular systolic function.  LVEDP 15  mmHg.  Ejection fraction 20-25%. 6.    Reduced cardiac index. Pulmonary artery saturation 60%. Mild to moderate pulmonary artery hypertension.  RECOMMENDATION:  Continue aggressive medical therapy for his cardiomyopathy. Discussed the results with Dr. Mare Ferrari. No reversibility was noted on his nuclear stress test. May want to consider viability study with cardiac MRI. If there is significant viability noted, he would be a candidate for CTO PCI of both the RCA and the circumflex.

## 2014-08-08 NOTE — Discharge Instructions (Signed)
Radial Site Care Refer to this sheet in the next few weeks. These instructions provide you with information on caring for yourself after your procedure. Your caregiver may also give you more specific instructions. Your treatment has been planned according to current medical practices, but problems sometimes occur. Call your caregiver if you have any problems or questions after your procedure. HOME CARE INSTRUCTIONS  You may shower the day after the procedure.Remove the bandage (dressing) and gently wash the site with plain soap and water.Gently pat the site dry.  Do not apply powder or lotion to the site.  Do not submerge the affected site in water for 3 to 5 days.  Inspect the site at least twice daily.  Do not flex or bend the affected arm for 24 hours.  No lifting over 5 pounds (2.3 kg) for 5 days after your procedure.  Do not drive home if you are discharged the same day of the procedure. Have someone else drive you.  You may drive 24 hours after the procedure unless otherwise instructed by your caregiver.  Do not operate machinery or power tools for 24 hours.  A responsible adult should be with you for the first 24 hours after you arrive home. What to expect:  Any bruising will usually fade within 1 to 2 weeks.  Blood that collects in the tissue (hematoma) may be painful to the touch. It should usually decrease in size and tenderness within 1 to 2 weeks. SEEK IMMEDIATE MEDICAL CARE IF:  You have unusual pain at the radial site.  You have redness, warmth, swelling, or pain at the radial site.  You have drainage (other than a small amount of blood on the dressing).  You have chills.  You have a fever or persistent symptoms for more than 72 hours.  You have a fever and your symptoms suddenly get worse.  Your arm becomes pale, cool, tingly, or numb.  You have heavy bleeding from the site. Hold pressure on the site and call 911. Document Released: 11/14/2010 Document  Revised: 01/04/2012 Document Reviewed: 11/14/2010 [facility] Patient Information 2015 [place], [place]. This information is not intended to replace advice given to you by your health care provider. Make sure you discuss any questions you have with your health care provider.                    Return To Work __Clyde Siler__ was treated at our facility. INJURY OR ILLNESS WAS: _____ Work-related __X___ Not work-related _____ Undetermined if work-related RETURN TO WORK  Employee may return to work unrestricted on: Tuesday  Oct. 20, 2015_  Employee may return to modified work on: _Friday Oct. 16, 2015__ [place] Work activities not tolerated include: _____ Bending _____ Prolonged sitting __X___ Lifting (no more than 5 pounds) _____ Squatting _____ Prolonged standing _____ Jason Parsons _____ Reaching __X___ Pushing and pulling _____ Walking _____ Other ____________________ Show this Return to Work statement to Optician, dispensing at work as soon as possible. Your employer should be aware of your condition and can help with the necessary work activity restrictions. If you wish to return to work sooner than the date above, or if you have further problems which make it difficult for you to return at that time, please call us or your caregiver. _Dr. Larey Brick Physician Name (Printed) _________________________________________ Physician Signature  ___10/14/2015___ Date Document Released: 10/12/2005 Document Revised: 01/04/2012 Document Reviewed: 03/29/2007 [employer] Patient Information 2015 [place], [place]. This information is not intended to replace advice  given to you by your health care provider. Make sure you discuss any questions you have with your health care provider.

## 2014-08-08 NOTE — Interval H&P Note (Signed)
Cath Lab Visit (complete for each Cath Lab visit)  Clinical Evaluation Leading to the Procedure:   ACS: No.  Non-ACS:    Anginal Classification: CCS II  Anti-ischemic medical therapy: Maximal Therapy (2 or more classes of medications)  Non-Invasive Test Results: High-risk stress test findings: cardiac mortality >3%/year  Prior CABG: No previous CABG      History and Physical Interval Note:  08/08/2014 8:51 AM  Jason Parsons  has presented today for surgery, with the diagnosis of sob, abnormal nuclear stress test  The various methods of treatment have been discussed with the patient and family. After consideration of risks, benefits and other options for treatment, the patient has consented to  Procedure(s): LEFT AND RIGHT HEART CATHETERIZATION WITH CORONARY ANGIOGRAM (N/A) as a surgical intervention .  The patient's history has been reviewed, patient examined, no change in status, stable for surgery.  I have reviewed the patient's chart and labs.  Questions were answered to the patient's satisfaction.     VARANASI,JAYADEEP S.

## 2014-08-08 NOTE — H&P (View-Only) (Signed)
History and physical  Jason Parsons Date of Birth:  12-25-1955 [place] [address] [place], [place]  [address] [phone]        Fax   [phone]   History of Present Illness: This pleasant 58 year old gentleman is seen back for a followup office visit following his recent abnormal Myoview stress test.  On 08/03/2014 the patient had a Myoview stress test showing a large inferolateral scar with no reversible ischemia and with an ejection fraction of 20%.  The left ventricle was dilated.  On 08/02/14 the patient had an echocardiogram which showed an ejection fraction of 20-25% with wall motion abnormalities in the inferior and lateral wall and there was grade 3 diastolic dysfunction.  There was severe mitral regurgitation and his pulmonary artery pressure was 66 mmHg.  We had initially seen him in consultation for Dr. Windell Moment.  History is as follows:This 58 year old gentleman is seen at the request of Reginia Forts M.D. of urgent care on [address]. he is being seen because shortness of breath.  He denies any orthopnea or paroxysmal nocturnal dyspnea.  No peripheral edema.  The patient has not noticed any significant increase in urine output since we put him on low-dose Lasix over the past weekend. The patient has a history of emphysema. He has been a heavy smoker. He is trying to quit. He smokes one pack of cigarettes a day. His last chest x-ray in 2013 showed emphysema. The patient is not diabetic. He has mild hyper lipidemia with LDL cholesterol of 113 in January 2015.  He was seen at urgent care in August 2015 with chest pressure or and questionable arm radiation. He had some nonspecific T wave changes on his EKG in the inferior leads and he was advised to go to the hospital. However, he refused hospitalization. He has had no further episodes of chest discomfort. He continues to have exertional dyspnea walking up stairs and walking uphill.  He has a past history of  having had a stroke in 2010 and was hospitalized in [place]. He was on blood thinner for a while and then switched to aspirin.  The patient has a history of having had a heart murmur since childhood.  Family history reveals that his mother is living at age 56 and has arthritis but no heart problems.  The patient's father died at age 57 of complications of rheumatic fever, possibly endocarditis.  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  Current Outpatient Prescriptions  Medication Sig Dispense Refill  . aspirin EC 325 MG EC tablet Take 1 tablet (325 mg total) by mouth daily.  30 tablet  0  . cycloSPORINE (RESTASIS) 0.05 % ophthalmic emulsion Place 1 drop into both eyes daily.      . furosemide (LASIX) 20 MG tablet Take 1 tablet (20 mg total) by mouth daily.  30 tablet  2  . nitroGLYCERIN (NITROSTAT) 0.4 MG SL tablet Place 1 tablet (0.4 mg total) under the tongue every 5 (five) minutes as needed for chest pain.  25 tablet  prn  . tiotropium (SPIRIVA) 18 MCG inhalation capsule Place 1 capsule (18 mcg total) into inhaler and inhale daily.  30 capsule  12  . traZODone (DESYREL) 50 MG tablet Take 1-2 tablets (50-100 mg total) by mouth at bedtime as needed for sleep.  60 tablet  5  . UNABLE TO FIND Med  Name: patient states he uses a cream for his face unknown name      . valACYclovir (VALTREX) 500 MG tablet Take 1 tablet (500 mg total) by mouth daily.  90 tablet  3  . carvedilol (COREG) 3.125 MG tablet Take 1 tablet (3.125 mg total) by mouth 2 (two) times daily.  60 tablet  5   No current facility-administered medications for this visit.    No Known Allergies  Patient Active Problem List   Diagnosis Date Noted  . Abnormal nuclear stress test 08/06/2014  . Cardiomyopathy, ischemic 08/06/2014  . DOE (dyspnea on exertion) 07/26/2014  . Pressure in chest 07/26/2014  . Abnormal EKG  07/26/2014  . Heart murmur previously undiagnosed 07/26/2014  . Stroke 11/25/2013  . Tobacco abuse 11/25/2013  . CVA (cerebral infarction) 11/24/2013    History  Smoking status  . Current Every Day Smoker -- 35 years  . Types: Cigarettes  Smokeless tobacco  . Never Used    History  Alcohol Use  . Yes    Family History  Problem Relation Age of Onset  . Arthritis Mother     Review of Systems: Constitutional: no fever chills diaphoresis or fatigue or change in weight.  Head and neck: no hearing loss, no epistaxis, no photophobia or visual disturbance. Respiratory: No cough, shortness of breath or wheezing. Cardiovascular: No chest pain peripheral edema, palpitations. Gastrointestinal: No abdominal distention, no abdominal pain, no change in bowel habits hematochezia or melena. Genitourinary: No dysuria, no frequency, no urgency, no nocturia. Musculoskeletal:No arthralgias, no back pain, no gait disturbance or myalgias. Neurological: No dizziness, no headaches, no numbness, no seizures, no syncope, no weakness, no tremors.  Prior history of stroke Hematologic: No lymphadenopathy, no easy bruising. Psychiatric: No confusion, no hallucinations, no sleep disturbance.    Physical Exam: Filed Vitals:   08/06/14 1431  BP: 114/60  Pulse: 70  The patient appears to be in no distress.  Head and neck exam reveals that the pupils are equal and reactive.  The extraocular movements are full.  There is no scleral icterus.  Mouth and pharynx are benign.  No lymphadenopathy.  No carotid bruits.  The jugular venous pressure is normal.  Thyroid is not enlarged or tender.  Chest is clear to percussion and auscultation.  No rales or rhonchi.  Expansion of the chest is symmetrical.  Heart reveals no abnormal lift or heave.  First and second heart sounds are normal.  There is a grade 1/6 apical systolic murmur.  The abdomen is soft and nontender.  Bowel sounds are normoactive.  There is no  hepatosplenomegaly or mass.  There are no abdominal bruits.  There are old abdominal scar secondary to previously reversed colostomy.  Extremities reveal no phlebitis or edema.  Pedal pulses are good.  There is no cyanosis or clubbing.  Neurologic exam is normal strength and no lateralizing weakness.  No sensory deficits.  Integument reveals no rash  EKG shows normal sinus rhythm with inferior wall T-wave inversion increased since 06/18/14  Assessment / Plan: 1. ischemic cardiomyopathy with ejection fraction 20-25% by echo and Myoview 2. 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 severe mitral regurgitation by echo 4. 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 exertional dyspnea secondary to all of the above 6. history of remote stroke 2010  Disposition: We will arrange for right and left heart cardiac catheterization on Wednesday, August 08, 2014. Continue low-dose Lasix 20 mg daily.  We will  add carvedilol 3.125 mg one twice a day After cardiac catheterization we will want to add ACE inhibitor or an ARB. We will obtain preoperative labs today and he will also get a chest x-ray today. The risks and benefits of a cardiac catheterization including, but not limited to, death, stroke, MI, kidney damage and bleeding were discussed with the patient who indicates understanding and agrees to proceed.

## 2014-09-03 ENCOUNTER — Ambulatory Visit (INDEPENDENT_AMBULATORY_CARE_PROVIDER_SITE_OTHER): Payer: BC Managed Care – PPO | Admitting: Family Medicine

## 2014-09-03 ENCOUNTER — Other Ambulatory Visit: Payer: Self-pay | Admitting: *Deleted

## 2014-09-03 ENCOUNTER — Encounter: Payer: Self-pay | Admitting: Family Medicine

## 2014-09-03 VITALS — BP 118/70 | HR 60 | Temp 97.9°F | Resp 16 | Ht 69.0 in | Wt 153.8 lb

## 2014-09-03 DIAGNOSIS — R634 Abnormal weight loss: Secondary | ICD-10-CM

## 2014-09-03 DIAGNOSIS — Z1211 Encounter for screening for malignant neoplasm of colon: Secondary | ICD-10-CM

## 2014-09-03 DIAGNOSIS — Z720 Tobacco use: Secondary | ICD-10-CM

## 2014-09-03 DIAGNOSIS — Z0000 Encounter for general adult medical examination without abnormal findings: Secondary | ICD-10-CM

## 2014-09-03 DIAGNOSIS — Z23 Encounter for immunization: Secondary | ICD-10-CM

## 2014-09-03 DIAGNOSIS — Z131 Encounter for screening for diabetes mellitus: Secondary | ICD-10-CM

## 2014-09-03 DIAGNOSIS — E785 Hyperlipidemia, unspecified: Secondary | ICD-10-CM

## 2014-09-03 DIAGNOSIS — Z125 Encounter for screening for malignant neoplasm of prostate: Secondary | ICD-10-CM

## 2014-09-03 DIAGNOSIS — Z7251 High risk heterosexual behavior: Secondary | ICD-10-CM

## 2014-09-03 DIAGNOSIS — R9431 Abnormal electrocardiogram [ECG] [EKG]: Secondary | ICD-10-CM

## 2014-09-03 DIAGNOSIS — I255 Ischemic cardiomyopathy: Secondary | ICD-10-CM

## 2014-09-03 DIAGNOSIS — J449 Chronic obstructive pulmonary disease, unspecified: Secondary | ICD-10-CM

## 2014-09-03 DIAGNOSIS — Z8673 Personal history of transient ischemic attack (TIA), and cerebral infarction without residual deficits: Secondary | ICD-10-CM

## 2014-09-03 DIAGNOSIS — N529 Male erectile dysfunction, unspecified: Secondary | ICD-10-CM

## 2014-09-03 LAB — POCT URINALYSIS DIPSTICK
BILIRUBIN UA: NEGATIVE
GLUCOSE UA: NEGATIVE
Ketones, UA: NEGATIVE
Leukocytes, UA: NEGATIVE
NITRITE UA: NEGATIVE
Protein, UA: NEGATIVE
Urobilinogen, UA: 0.2
pH, UA: 5

## 2014-09-03 LAB — CBC WITH DIFFERENTIAL/PLATELET
BASOS ABS: 0.1 10*3/uL (ref 0.0–0.1)
BASOS PCT: 1 % (ref 0–1)
EOS PCT: 3 % (ref 0–5)
Eosinophils Absolute: 0.2 10*3/uL (ref 0.0–0.7)
HCT: 46.8 % (ref 39.0–52.0)
Hemoglobin: 16.3 g/dL (ref 13.0–17.0)
Lymphocytes Relative: 29 % (ref 12–46)
Lymphs Abs: 1.9 10*3/uL (ref 0.7–4.0)
MCH: 35.1 pg — ABNORMAL HIGH (ref 26.0–34.0)
MCHC: 34.8 g/dL (ref 30.0–36.0)
MCV: 100.6 fL — ABNORMAL HIGH (ref 78.0–100.0)
Monocytes Absolute: 0.5 10*3/uL (ref 0.1–1.0)
Monocytes Relative: 8 % (ref 3–12)
NEUTROS ABS: 4 10*3/uL (ref 1.7–7.7)
Neutrophils Relative %: 59 % (ref 43–77)
Platelets: 210 10*3/uL (ref 150–400)
RBC: 4.65 MIL/uL (ref 4.22–5.81)
RDW: 12.7 % (ref 11.5–15.5)
WBC: 6.7 10*3/uL (ref 4.0–10.5)

## 2014-09-03 LAB — IFOBT (OCCULT BLOOD): IMMUNOLOGICAL FECAL OCCULT BLOOD TEST: NEGATIVE

## 2014-09-03 MED ORDER — ALBUTEROL SULFATE HFA 108 (90 BASE) MCG/ACT IN AERS: 2.0000 | INHALATION_SPRAY | Freq: Four times a day (QID) | RESPIRATORY_TRACT | Status: AC | PRN

## 2014-09-03 MED ORDER — VARENICLINE TARTRATE 0.5 MG X 11 & 1 MG X 42 PO MISC: ORAL | Status: DC

## 2014-09-03 MED ORDER — VARENICLINE TARTRATE 1 MG PO TABS: 1.0000 mg | ORAL_TABLET | Freq: Two times a day (BID) | ORAL | Status: DC

## 2014-09-03 NOTE — Patient Instructions (Addendum)
1.  CALL TO SCHEDULE COLONOSCOPY. 2.  MENTION AORTIC ANEURYSM TO THE CARDIOLOGIST. 3.  GOOD LUCK WITH SMOKING!!    Keeping you healthy    Get these tests  Blood pressure- Have your blood pressure checked once a year by your healthcare provider.  Normal blood pressure is 120/80  Weight- Have your body mass index (BMI) calculated to screen for obesity.  BMI is a measure of body fat based on height and weight. You can also calculate your own BMI at ViewBanking.[email or web address].  Cholesterol- Have your cholesterol checked every year.  Diabetes- Have your blood sugar checked regularly if you have high blood pressure, high cholesterol, have a family history of diabetes or if you are overweight.  Screening for Colon Cancer- Colonoscopy starting at age 48.  Screening may begin sooner depending on your family history and other health conditions. Follow up colonoscopy as directed by your Gastroenterologist.  Screening for Prostate Cancer- Both blood work (PSA) and a rectal exam help screen for Prostate Cancer.  Screening begins at age 4 with African-American men and at age 2 with Caucasian men.  Screening may begin sooner depending on your family history.  Take these medicines  Aspirin- One aspirin daily can help prevent Heart disease and Stroke.  Flu shot- Every fall.  Tetanus- Every 10 years.  Zostavax- Once after the age of 7 to prevent Shingles.  Pneumonia shot- Once after the age of 42; if you are younger than 44, ask your healthcare provider if you need a Pneumonia shot.  Take these steps  Don't smoke- If you do smoke, talk to your doctor about quitting.  For tips on how to quit, go to www.smokefree.gov or call 1-800-QUIT-NOW.  Be physically active- Exercise 5 days a week for at least 30 minutes.  If you are not already physically active start slow and gradually work up to 30 minutes of moderate physical activity.  Examples of moderate activity include walking briskly, mowing the  yard, dancing, swimming, bicycling, etc.  Eat a healthy diet- Eat a variety of healthy food such as fruits, vegetables, low fat milk, low fat cheese, yogurt, lean meant, poultry, fish, beans, tofu, etc. For more information go to www.thenutritionsource.org  Drink alcohol in moderation- Limit alcohol intake to less than two drinks a day. Never drink and drive.  Dentist- Brush and floss twice daily; visit your dentist twice a year.  Depression- Your emotional health is as important as your physical health. If you're feeling down, or losing interest in things you would normally enjoy please talk to your healthcare provider.  Eye exam- Visit your eye doctor every year.  Safe sex- If you may be exposed to a sexually transmitted infection, use a condom.  Seat belts- Seat belts can save your life; always wear one.  Smoke/Carbon Monoxide detectors- These detectors need to be installed on the appropriate level of your home.  Replace batteries at least once a year.  Skin cancer- When out in the sun, cover up and use sunscreen 15 SPF or higher.  Violence- If anyone is threatening you, please tell your healthcare provider.  Living Will/ Health care power of attorney- Speak with your healthcare provider and family.

## 2014-09-03 NOTE — Progress Notes (Signed)
   Subjective:    Patient ID: Jason Parsons, male    DOB: 1956/06/03, 58 y.o.   MRN: [id number]  HPI    Review of Systems  Constitutional: Negative.   HENT: Negative.   Eyes: Negative.   Respiratory: Negative.   Cardiovascular: Negative.   Gastrointestinal: Negative.   Endocrine: Negative.   Genitourinary: Negative.   Musculoskeletal: Negative.   Skin: Negative.   Allergic/Immunologic: Negative.   Neurological: Negative.   Hematological: Negative.   Psychiatric/Behavioral: Negative.        Objective:   Physical Exam        Assessment & Plan:

## 2014-09-03 NOTE — Progress Notes (Signed)
Subjective:    Patient ID: Jason Parsons, male    DOB: 1956-04-26, 58 y.o.   MRN: [id number]  09/03/2014  Annual Exam   HPI This 58 y.o. male presents for Complete Physical Examination and three month follow-up.   Last physical a while ago;never?  1982? Colonoscopy 2010. Repeat in 5 years; just received notice.   Tetanus unknown. Pneumovax 06/18/2014. Flu vaccine 2014.  Scheduled at work this month. Eye exam [facility]; yearly.  10/2013.  No glaucoma; + early cataracts. Dental exam changing dentists.  Just had a crown placed.    DOE:  Referred to cardiology after last visit due to DOE, abnormal EKG.  Failed stress testing; referred for cardiac catheterization; EF 20-25%; +diffuse CAD with collaterals; no stenting placed.  Appointment for follow-up on Thursday.  Started Carvedilol and Lasix after cardiac cath.  Feels better on medication; more energy and less DOE.  S/p spirometry at last visit which revealed moderate obstruction; started on Spiriva; less DOE; requesting refill of Albuterol.    Weight loss:  Has lost 15 pounds unintentional in past six months.  Due for colonoscopy.  No previous prostate exam.  Admits to a lot of stress lately.  S/p CXR in past three months that was normal.    R ear  foreign body sensation: intermittent.  No FOB.  Hearing is fine.  No ringing in ears.    Insomnia: sleeping better with 2 Trazodone qhs.  Tobacco abuse: continues to smoke 1ppd; tolerated Chantix well other than insomnia; desires rx for Chantix again now that sleeping well with Trazodone.  Review of Systems  Constitutional: Positive for unexpected weight change. Negative for fever, chills, diaphoresis, activity change, appetite change and fatigue.  HENT: Negative for congestion, dental problem, drooling, ear discharge, ear pain, facial swelling, hearing loss, mouth sores, nosebleeds, postnasal drip, rhinorrhea, sinus pressure, sneezing, sore throat, tinnitus, trouble swallowing and voice change.    Eyes: Negative for photophobia, pain, discharge, redness, itching and visual disturbance.  Respiratory: Positive for cough and shortness of breath. Negative for apnea, choking, chest tightness, wheezing and stridor.   Cardiovascular: Negative for chest pain, palpitations and leg swelling.  Gastrointestinal: Negative for nausea, vomiting, abdominal pain, diarrhea, constipation and blood in stool.  Endocrine: Negative for cold intolerance, heat intolerance, polydipsia, polyphagia and polyuria.  Genitourinary: Negative for dysuria, urgency, frequency, hematuria, flank pain, decreased urine volume, discharge, penile swelling, scrotal swelling, enuresis, difficulty urinating, genital sores, penile pain and testicular pain.  Musculoskeletal: Negative for myalgias, back pain, joint swelling, arthralgias, gait problem, neck pain and neck stiffness.  Skin: Negative for color change, pallor, rash and wound.  Allergic/Immunologic: Negative for environmental allergies, food allergies and immunocompromised state.  Neurological: Negative for dizziness, tremors, seizures, syncope, facial asymmetry, speech difficulty, weakness, light-headedness, numbness and headaches.  Hematological: Negative for adenopathy. Does not bruise/bleed easily.  Psychiatric/Behavioral: Negative for suicidal ideas, hallucinations, behavioral problems, confusion, sleep disturbance, self-injury, dysphoric mood, decreased concentration and agitation. The patient is not nervous/anxious and is not hyperactive.     Past Medical History  Diagnosis Date  . Diverticulitis     with perforation; s/p colostomy with reversal.  . Abdominal aortic aneurysm 06/26/2012    3 cm distal  . Insomnia   . Genital herpes   . CVA (cerebral infarction) 10/26/2013    near syncope, blurred vision; L sided weakness and numbness.    . Seizures   . Depression   . Emphysema of lung    Past Surgical History  Procedure Laterality Date  . Bunionectomy    .  Plantar fibroma    . Admission      L knee septic joint MRSA after steroid injection L knee.   PICC line iv abx.  . Joint replacement      L TKR. Alusio.  . Colostomy  08/26/2009    diverticulitis with perforation.  . Colostomy reversal  03/26/2010  . Small intestine surgery     No Known Allergies Current Outpatient Prescriptions  Medication Sig Dispense Refill  . aspirin EC 325 MG EC tablet Take 1 tablet (325 mg total) by mouth daily. 30 tablet 0  . carvedilol (COREG) 3.125 MG tablet Take 1 tablet (3.125 mg total) by mouth 2 (two) times daily. 60 tablet 5  . cycloSPORINE (RESTASIS) 0.05 % ophthalmic emulsion Place 1 drop into both eyes daily.    . furosemide (LASIX) 20 MG tablet Take 1 tablet (20 mg total) by mouth daily. 30 tablet 2  . nitroGLYCERIN (NITROSTAT) 0.4 MG SL tablet Place 1 tablet (0.4 mg total) under the tongue every 5 (five) minutes as needed for chest pain. 25 tablet prn  . tiotropium (SPIRIVA) 18 MCG inhalation capsule Place 1 capsule (18 mcg total) into inhaler and inhale daily. 30 capsule 12  . traZODone (DESYREL) 50 MG tablet Take 100 mg by mouth at bedtime.    . valACYclovir (VALTREX) 500 MG tablet Take 1 tablet (500 mg total) by mouth daily. 90 tablet 3  . albuterol (PROVENTIL HFA;VENTOLIN HFA) 108 (90 BASE) MCG/ACT inhaler Inhale 2 puffs into the lungs every 6 (six) hours as needed for wheezing or shortness of breath. 1 Inhaler 2  . varenicline (CHANTIX CONTINUING MONTH PAK) 1 MG tablet Take 1 tablet (1 mg total) by mouth 2 (two) times daily. 60 tablet 2  . varenicline (CHANTIX STARTING MONTH PAK) 0.5 MG X 11 & 1 MG X 42 tablet Take one 0.5 mg tablet by mouth once daily for 3 days, then increase to one 0.5 mg tablet twice daily for 4 days, then increase to one 1 mg tablet twice daily. 53 tablet 0   No current facility-administered medications for this visit.       Objective:    BP 118/70 mmHg  Pulse 60  Temp(Src) 97.9 F (36.6 C) (Oral)  Resp 16  Ht _0   (1.753 m)  Wt 153 lb 12.8 oz (69.763 kg)  BMI 22.70 kg/m2  SpO2 100% Physical Exam  Constitutional: He is oriented to person, place, and time. He appears well-developed and well-nourished. No distress.  HENT:  Head: Normocephalic and atraumatic.  Right Ear: External ear normal.  Left Ear: External ear normal.  Nose: Nose normal.  Mouth/Throat: Oropharynx is clear and moist.  Eyes: Conjunctivae and EOM are normal. Pupils are equal, round, and reactive to light.  Neck: Normal range of motion. Neck supple. Carotid bruit is not present. No thyromegaly present.  Cardiovascular: Normal rate, regular rhythm, normal heart sounds and intact distal pulses.  Exam reveals no gallop and no friction rub.   No murmur heard. Pulmonary/Chest: Effort normal and breath sounds normal. He has no wheezes. He has no rales.  Abdominal: Soft. Bowel sounds are normal. He exhibits no distension and no mass. There is no tenderness. There is no rebound and no guarding. Hernia confirmed negative in the right inguinal area and confirmed negative in the left inguinal area.  Genitourinary: Prostate normal, testes normal and penis normal. Rectal exam shows no mass, no tenderness and anal  tone normal. Right testis shows no mass, no swelling and no tenderness. Right testis is descended. Left testis shows no mass, no swelling and no tenderness. Left testis is descended. Circumcised.  Musculoskeletal:       Right shoulder: Normal.       Left shoulder: Normal.       Cervical back: Normal.  Lymphadenopathy:    He has no cervical adenopathy.       Right: No inguinal adenopathy present.       Left: No inguinal adenopathy present.  Neurological: He is alert and oriented to person, place, and time. He has normal reflexes. No cranial nerve deficit. He exhibits normal muscle tone. Coordination normal.  Skin: Skin is warm and dry. No rash noted. He is not diaphoretic.  Psychiatric: He has a normal mood and affect. His behavior is  normal. Judgment and thought content normal.   Results for orders placed or performed in visit on 09/03/14  GC/Chlamydia Probe Amp  Result Value Ref Range   CT Probe RNA NEGATIVE    GC Probe RNA NEGATIVE   CBC with Differential  Result Value Ref Range   WBC 6.7 4.0 - 10.5 K/uL   RBC 4.65 4.22 - 5.81 MIL/uL   Hemoglobin 16.3 13.0 - 17.0 g/dL   HCT 46.8 39.0 - 52.0 %   MCV 100.6 (H) 78.0 - 100.0 fL   MCH 35.1 (H) 26.0 - 34.0 pg   MCHC 34.8 30.0 - 36.0 g/dL   RDW 12.7 11.5 - 15.5 %   Platelets 210 150 - 400 K/uL   Neutrophils Relative % 59 43 - 77 %   Neutro Abs 4.0 1.7 - 7.7 K/uL   Lymphocytes Relative 29 12 - 46 %   Lymphs Abs 1.9 0.7 - 4.0 K/uL   Monocytes Relative 8 3 - 12 %   Monocytes Absolute 0.5 0.1 - 1.0 K/uL   Eosinophils Relative 3 0 - 5 %   Eosinophils Absolute 0.2 0.0 - 0.7 K/uL   Basophils Relative 1 0 - 1 %   Basophils Absolute 0.1 0.0 - 0.1 K/uL   Smear Review Criteria for review not met   COMPLETE METABOLIC PANEL WITH GFR  Result Value Ref Range   Sodium 132 (L) 135 - 145 mEq/L   Potassium 4.5 3.5 - 5.3 mEq/L   Chloride 96 96 - 112 mEq/L   CO2 25 19 - 32 mEq/L   Glucose, Bld 78 70 - 99 mg/dL   BUN 13 6 - 23 mg/dL   Creat 0.88 0.50 - 1.35 mg/dL   Total Bilirubin 0.9 0.2 - 1.2 mg/dL   Alkaline Phosphatase 77 39 - 117 U/L   AST 23 0 - 37 U/L   ALT 17 0 - 53 U/L   Total Protein 7.2 6.0 - 8.3 g/dL   Albumin 4.2 3.5 - 5.2 g/dL   Calcium 9.6 8.4 - 10.5 mg/dL   GFR, Est African American >89 mL/min   GFR, Est Non African American >89 mL/min  Hemoglobin A1c  Result Value Ref Range   Hgb A1c MFr Bld 5.7 (H) <5.7 %   Mean Plasma Glucose 117 (H) <117 mg/dL  Lipid panel  Result Value Ref Range   Cholesterol 180 0 - 200 mg/dL   Triglycerides 84 <150 mg/dL   HDL 51 >39 mg/dL   Total CHOL/HDL Ratio 3.5 Ratio   VLDL 17 0 - 40 mg/dL   LDL Cholesterol 112 (H) 0 - 99 mg/dL  PSA  Result  Value Ref Range   PSA 0.62 <=4.00 ng/mL  HIV antibody  Result Value Ref  Range   HIV 1&2 Ab, 4th Generation NONREACTIVE NONREACTIVE  RPR  Result Value Ref Range   RPR NON REAC NON REAC  Testosterone, free, total  Result Value Ref Range   Testosterone 666 300 - 890 ng/dL   Sex Hormone Binding 49 13 - 71 nmol/L   Testosterone, Free 113.6 47.0 - 244.0 pg/mL   Testosterone-% Free 1.7 1.6 - 2.9 %  POCT urinalysis dipstick  Result Value Ref Range   Color, UA yellow    Clarity, UA clear    Glucose, UA neg    Bilirubin, UA neg    Ketones, UA neg    Spec Grav, UA <=1.005    Blood, UA trace    pH, UA 5.0    Protein, UA neg    Urobilinogen, UA 0.2    Nitrite, UA neg    Leukocytes, UA Negative   IFOBT POC (occult bld, rslt in office)  Result Value Ref Range   IFOBT Negative    TDAP ADMINISTERED.    Assessment & Plan:   1. Routine general medical examination at a health care facility   2. Screening for diabetes mellitus   3. Screening for prostate cancer   4. Hyperlipidemia   5. High risk sexual behavior   6. Screening for colon cancer   7. Tobacco abuse   8. Loss of weight   9. Impotence of organic origin   10. Abnormal EKG   11. Cardiomyopathy, ischemic   12. H/O: CVA (cerebrovascular accident)   62. Chronic obstructive pulmonary disease, unspecified COPD, unspecified chronic bronchitis type      1.  Complete Physical Examination: anticipatory guidance --- smoking cessation, exercise.  S/p TDAP; to receive flu vaccine at work.  Due for colonoscopy and plans to schedule.   2.  Screening diabetes: obtain glucose and HgbA1c 3. Screening prostate cancer: obtain PSA; DRE completed and normal. 4.  Hyperlipidemia: stable; obtain labs.  Warrants statin due to CAD, CVA hx. 5.  COPD: improved with daily Spiriva; repeat spirometry next visit.   Rx for Albuterol provided. 6.  High risk sexual behavior: obtain GC/Chlam/RPR/HIV.  Known genital HSV. 7.  Screening colon cancer: obtain hemosure; due for colonoscopy. 8.  Tobacco abuse: contemplative; rx for  Chantix provided; advised on use. 9.  Weight loss:  New.  Obtain labs.  Repeat colonoscopy. If worsens, obtain CT chest/abd/pelvis. 10. Cardiomyopathy: New.  EF 25-30% with CAD; follow-up with cardiology this week; recently started on Carvedilol and Lasix. 11. CVA hx: stable; continue ASA 347m daily; smoking cessation. 12.  Abdominal aortic aneurysm: clarify if imaged during recent cath; do not see in report.  Likely warrants repeat imaging.  To discuss with cardiology this week on follow-up.    Meds ordered this encounter  Medications  . albuterol (PROVENTIL HFA;VENTOLIN HFA) 108 (90 BASE) MCG/ACT inhaler    Sig: Inhale 2 puffs into the lungs every 6 (six) hours as needed for wheezing or shortness of breath.    Dispense:  1 Inhaler    Refill:  2  . varenicline (CHANTIX STARTING MONTH PAK) 0.5 MG X 11 & 1 MG X 42 tablet    Sig: Take one 0.5 mg tablet by mouth once daily for 3 days, then increase to one 0.5 mg tablet twice daily for 4 days, then increase to one 1 mg tablet twice daily.    Dispense:  53 tablet  Refill:  0  . DISCONTD: varenicline (CHANTIX CONTINUING MONTH PAK) 1 MG tablet    Sig: Take 1 tablet (1 mg total) by mouth 2 (two) times daily.    Dispense:  60 tablet    Refill:  2  . varenicline (CHANTIX CONTINUING MONTH PAK) 1 MG tablet    Sig: Take 1 tablet (1 mg total) by mouth 2 (two) times daily.    Dispense:  60 tablet    Refill:  2    Return in about 3 months (around 12/04/2014) for recheck.    Reginia Forts, M.D.  [facility] [address] [place], [place]  [address] [phone] phone [phone] fax

## 2014-09-04 ENCOUNTER — Encounter: Payer: Self-pay | Admitting: Family Medicine

## 2014-09-04 DIAGNOSIS — J449 Chronic obstructive pulmonary disease, unspecified: Secondary | ICD-10-CM | POA: Insufficient documentation

## 2014-09-04 DIAGNOSIS — Z8673 Personal history of transient ischemic attack (TIA), and cerebral infarction without residual deficits: Secondary | ICD-10-CM | POA: Insufficient documentation

## 2014-09-04 LAB — GC/CHLAMYDIA PROBE AMP
CT PROBE, AMP APTIMA: NEGATIVE
GC Probe RNA: NEGATIVE

## 2014-09-04 LAB — COMPLETE METABOLIC PANEL WITH GFR
ALK PHOS: 77 U/L (ref 39–117)
ALT: 17 U/L (ref 0–53)
AST: 23 U/L (ref 0–37)
Albumin: 4.2 g/dL (ref 3.5–5.2)
BUN: 13 mg/dL (ref 6–23)
CO2: 25 mEq/L (ref 19–32)
Calcium: 9.6 mg/dL (ref 8.4–10.5)
Chloride: 96 mEq/L (ref 96–112)
Creat: 0.88 mg/dL (ref 0.50–1.35)
GFR, Est African American: >89 mL/min
GFR, Est Non African American: >89 mL/min
Glucose, Bld: 78 mg/dL (ref 70–99)
Potassium: 4.5 mEq/L (ref 3.5–5.3)
Sodium: 132 mEq/L — ABNORMAL LOW (ref 135–145)
Total Bilirubin: 0.9 mg/dL (ref 0.2–1.2)
Total Protein: 7.2 g/dL (ref 6.0–8.3)

## 2014-09-04 LAB — LIPID PANEL
CHOLESTEROL: 180 mg/dL (ref 0–200)
HDL: 51 mg/dL (ref >39)
LDL Cholesterol: 112 mg/dL — ABNORMAL HIGH (ref 0–99)
TRIGLYCERIDES: 84 mg/dL (ref <150)
Total CHOL/HDL Ratio: 3.5 Ratio
VLDL: 17 mg/dL (ref 0–40)

## 2014-09-04 LAB — PSA: PSA: 0.62 ng/mL (ref <=4.00)

## 2014-09-04 LAB — TESTOSTERONE, FREE, TOTAL, SHBG
Sex Hormone Binding: 49 nmol/L (ref 13–71)
Testosterone, Free: 113.6 pg/mL (ref 47.0–244.0)
Testosterone-% Free: 1.7 % (ref 1.6–2.9)
Testosterone: 666 ng/dL (ref 300–890)

## 2014-09-04 LAB — HIV ANTIBODY (ROUTINE TESTING W REFLEX): HIV 1&2 Ab, 4th Generation: NONREACTIVE

## 2014-09-04 LAB — HEMOGLOBIN A1C
Hgb A1c MFr Bld: 5.7 % — ABNORMAL HIGH (ref <5.7)
MEAN PLASMA GLUCOSE: 117 mg/dL — AB (ref <117)

## 2014-09-04 LAB — RPR

## 2014-09-06 ENCOUNTER — Ambulatory Visit: Payer: BC Managed Care – PPO | Admitting: Cardiology

## 2014-09-11 MED ORDER — ATORVASTATIN CALCIUM 10 MG PO TABS: 10.0000 mg | ORAL_TABLET | Freq: Every day | ORAL | Status: DC

## 2014-09-11 NOTE — Addendum Note (Signed)
Addended by: Wardell Honour on: 09/11/2014 11:01 PM   Modules accepted: Orders

## 2014-10-04 ENCOUNTER — Encounter (HOSPITAL_COMMUNITY): Payer: Self-pay | Admitting: Interventional Cardiology

## 2014-10-09 ENCOUNTER — Telehealth: Payer: Self-pay | Admitting: Family Medicine

## 2014-10-09 ENCOUNTER — Encounter: Payer: Self-pay | Admitting: Cardiology

## 2014-10-09 ENCOUNTER — Ambulatory Visit (INDEPENDENT_AMBULATORY_CARE_PROVIDER_SITE_OTHER): Payer: BC Managed Care – PPO | Admitting: Cardiology

## 2014-10-09 VITALS — BP 116/84 | HR 63 | Ht 70.0 in | Wt 158.0 lb

## 2014-10-09 DIAGNOSIS — I2582 Chronic total occlusion of coronary artery: Secondary | ICD-10-CM

## 2014-10-09 DIAGNOSIS — I340 Nonrheumatic mitral (valve) insufficiency: Secondary | ICD-10-CM

## 2014-10-09 DIAGNOSIS — I255 Ischemic cardiomyopathy: Secondary | ICD-10-CM

## 2014-10-09 DIAGNOSIS — R0609 Other forms of dyspnea: Secondary | ICD-10-CM

## 2014-10-09 LAB — BASIC METABOLIC PANEL
BUN: 14 mg/dL (ref 6–23)
CO2: 27 meq/L (ref 19–32)
Calcium: 9.4 mg/dL (ref 8.4–10.5)
Chloride: 100 mEq/L (ref 96–112)
Creatinine, Ser: 0.9 mg/dL (ref 0.4–1.5)
GFR: 94.39 mL/min (ref >60.00)
GLUCOSE: 94 mg/dL (ref 70–99)
Potassium: 4.2 mEq/L (ref 3.5–5.1)
SODIUM: 134 meq/L — AB (ref 135–145)

## 2014-10-09 NOTE — Telephone Encounter (Signed)
Patient is going to try and get the flu shot at work today

## 2014-10-09 NOTE — Patient Instructions (Signed)
Will obtain labs today and call you with the results (bmet)  Your physician recommends that you schedule a follow-up appointment in: 2 month ov/ekg   Your physician recommends that you continue on your current medications as directed. Please refer to the Current Medication list given to you today.  Your physician has requested that you have a cardiac MRI. Cardiac MRI uses a computer to create images of your heart as its beating, producing both still and moving pictures of your heart and major blood vessels. For further information please visit [email or web address] Please follow the instruction sheet given to you today for more information.

## 2014-10-09 NOTE — Progress Notes (Signed)
Quick Note:  Please report to patient. The recent labs are stable. Continue same medication and careful diet. ______ 

## 2014-10-09 NOTE — Progress Notes (Signed)
History and physical  Lindley Magnus Date of Birth:  10/19/1956 [place] [address] [place], [place]  [address] [phone]        Fax   [phone]   History of Present Illness: This pleasant 58 year old gentleman is seen back for a followup office visit following his recent cardiac catheterization.  On 08/03/2014 the patient had a Myoview stress test showing a large inferolateral scar with no reversible ischemia and with an ejection fraction of 20%.  The left ventricle was dilated.  On 08/02/14 the patient had an echocardiogram which showed an ejection fraction of 20-25% with wall motion abnormalities in the inferior and lateral wall and there was grade 3 diastolic dysfunction.  There was severe mitral regurgitation and his pulmonary artery pressure was 66 mmHg.  We had initially seen him in consultation for Dr. Windell Moment.  History is as follows:This 58 year old gentleman is seen at the request of Reginia Forts M.D. of urgent care on [address]. he is being seen because shortness of breath.  He denies any orthopnea or paroxysmal nocturnal dyspnea.  No peripheral edema.  The patient has not noticed any significant increase in urine output since we put him on low-dose Lasix over the past weekend. The patient has a history of emphysema. He has been a heavy smoker. He is trying to quit. He smokes one pack of cigarettes a day. His last chest x-ray in 2013 showed emphysema. The patient is not diabetic. He has mild hyper lipidemia with LDL cholesterol of 113 in January 2015.  He was seen at urgent care in August 2015 with chest pressure or and questionable arm radiation. He had some nonspecific T wave changes on his EKG in the inferior leads and he was advised to go to the hospital. However, he refused hospitalization. He has had no further episodes of chest discomfort. He continues to have exertional dyspnea walking up stairs and walking uphill.  He has a past history of having  had a stroke in 2010 and was hospitalized in [place]. He was on blood thinner for a while and then switched to aspirin.  The patient has a history of having had a heart murmur since childhood.  Family history reveals that his mother is living at age 54 and has arthritis but no heart problems.  The patient's father died at age 54 of complications of rheumatic fever, possibly endocarditis.  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 Patient Information    Patient Name Sex DOB SSN   Jason Parsons, Jason Parsons Male 04-14-1956 [id number]    CV Procedure by Jettie Booze, MD at 08/08/2014 9:40 AM    Author: Jettie Booze, MD Service: Cardiology Author Type: Physician   Filed: 08/08/2014 9:53 AM Note Time: 08/08/2014 9:40 AM Status: Addendum   Editor: Jettie Booze, MD (Physician)     Related Notes: Original Note by Jettie Booze, MD (Physician) filed at 08/08/2014 9:51 AM   Expand All Collapse All        PROCEDURE: Right heart cath. Left heart catheterization with selective coronary angiography, left ventriculogram.  INDICATIONS: Cardiomyopathy  The risks, benefits, and details of the procedure were explained to the patient. The patient verbalized understanding and wanted to proceed. Informed written consent was obtained.  PROCEDURE TECHNIQUE: After Xylocaine anesthesia a 5 French brachial sheath was exchanged for a peripheral IV in the  antecubital area and a 58F slender sheath was placed in the right radial artery with a single anterior needle wall stick. A 5 French Swan-Ganz catheter was advanced to the pulmonary artery. Oxygen saturations were performed. Hemodynamic pressure assessment was performed. Right coronary angiography was done using a Judkins R4 guide catheter. Left coronary angiography was done using a Judkins L3.5 guide catheter. Left  ventriculography was done using a pigtail catheter. A TR band was used for hemostasis. Manual compression was for hemostasis of the brachial sheath.  CONTRAST: Total of 85 cc.  COMPLICATIONS: None.   HEMODYNAMICS: Aortic pressure was 96/60; LV pressure was 96/12; LVEDP 15. There was no gradient between the left ventricle and aorta. Radiation pressure 14/11, mean right atrial pressure 9 mmHg; RV pressure 47 or 5, RVEDP 8 mmHg; pulmonary artery pressure 44/25, mean pulmonary artery pressure 34 mmHg, pulmonary We wedge pressure 21/31, mean pulmonary capillary wedge pressure 22 mmHg. Pulmonary artery saturation 60%. Aortic saturation 93%. Cardiac output 3.45 L per minute. Cardiac index 1.86.  ANGIOGRAPHIC DATA: The left main coronary artery is widely patent.  The left anterior descending artery is a large vessel which wraps around the apex. There are collaterals to the distal RCA system. There is a large diagonal which is widely patent. There is mild atherosclerosis in the LAD.  The left circumflex artery is a large vessel proximally. After a small second obtuse marginal, the circumflex is occluded. There are left to left collaterals feeding the distal circumflex system..  The right coronary artery is a large vessel. The mid vessel is occluded. There are some bridging collaterals that fill the distal vessel faintly.  LEFT VENTRICULOGRAM: Left ventricular angiogram was done in the 30 RAO projection and revealed severely decreased left ventricular systolic function with an estimated ejection fraction of 20-25 %. There is preserved contraction of the apex and anterior wall. There is severe hypokinesis of the inferior wall. LVEDP was 15 mmHg.  IMPRESSIONS:  1. Widely patent left main coronary artery. 2. Mild disease in the left anterior descending artery and its branches. 3. Occluded mid left circumflex artery. The distal circumflex feeds from left to left collaterals.   4. Occluded mid right coronary artery. The distal RCA feeds from bridging collaterals as well as left to right collaterals.  5. Severely decreased left ventricular systolic function. LVEDP 15 mmHg. Ejection fraction 20-25%. 6. Reduced cardiac index. Pulmonary artery saturation 60%. Mild to moderate pulmonary artery hypertension.  RECOMMENDATION: Continue aggressive medical therapy for his cardiomyopathy. Discussed the results with Dr. Mare Ferrari. No reversibility was noted on his nuclear stress test. May want to consider viability study with cardiac MRI. If there is significant viability noted, he would be a candidate for CTO PCI of both the RCA and the circumflex.             Current Outpatient Prescriptions  Medication Sig Dispense Refill  . albuterol (PROVENTIL HFA;VENTOLIN HFA) 108 (90 BASE) MCG/ACT inhaler Inhale 2 puffs into the lungs every 6 (six) hours as needed for wheezing or shortness of breath. 1 Inhaler 2  . aspirin EC 325 MG EC tablet Take 1 tablet (325 mg total) by mouth daily. 30 tablet 0  . atorvastatin (LIPITOR) 10 MG tablet Take 1 tablet (10 mg total) by mouth daily at 6 PM. 90 tablet 1  . carvedilol (COREG) 3.125 MG tablet Take 1 tablet (3.125 mg total) by mouth 2 (two) times daily. 60 tablet 5  . cycloSPORINE (RESTASIS) 0.05 % ophthalmic emulsion Place 1 drop  into both eyes daily.    . furosemide (LASIX) 20 MG tablet Take 1 tablet (20 mg total) by mouth daily. 30 tablet 2  . nitroGLYCERIN (NITROSTAT) 0.4 MG SL tablet Place 1 tablet (0.4 mg total) under the tongue every 5 (five) minutes as needed for chest pain. 25 tablet prn  . tiotropium (SPIRIVA) 18 MCG inhalation capsule Place 1 capsule (18 mcg total) into inhaler and inhale daily. 30 capsule 12  . traZODone (DESYREL) 50 MG tablet Take 100 mg by mouth at bedtime.    . valACYclovir (VALTREX) 500 MG tablet Take 1 tablet (500 mg total) by mouth daily. 90 tablet 3  . varenicline (CHANTIX CONTINUING MONTH  PAK) 1 MG tablet Take 1 tablet (1 mg total) by mouth 2 (two) times daily. 60 tablet 2   No current facility-administered medications for this visit.    No Known Allergies  Patient Active Problem List   Diagnosis Date Noted  . H/O: CVA (cerebrovascular accident) 09/04/2014  . COLD (chronic obstructive lung disease) 09/04/2014  . Abnormal nuclear stress test 08/06/2014  . Cardiomyopathy, ischemic 08/06/2014  . DOE (dyspnea on exertion) 07/26/2014  . Pressure in chest 07/26/2014  . Abnormal EKG 07/26/2014  . Heart murmur previously undiagnosed 07/26/2014  . Stroke 11/25/2013  . Tobacco abuse 11/25/2013  . CVA (cerebral infarction) 11/24/2013    History  Smoking status  . Current Every Day Smoker -- 35 years  . Types: Cigarettes  Smokeless tobacco  . Never Used    History  Alcohol Use  . 0.0 oz/week  . 0 Not specified per week    Comment: 5 drinks    Family History  Problem Relation Age of Onset  . Arthritis Mother     Review of Systems: Constitutional: no fever chills diaphoresis or fatigue or change in weight.  Head and neck: no hearing loss, no epistaxis, no photophobia or visual disturbance. Respiratory: No cough, shortness of breath or wheezing. Cardiovascular: No chest pain peripheral edema, palpitations. Gastrointestinal: No abdominal distention, no abdominal pain, no change in bowel habits hematochezia or melena. Genitourinary: No dysuria, no frequency, no urgency, no nocturia. Musculoskeletal:No arthralgias, no back pain, no gait disturbance or myalgias. Neurological: No dizziness, no headaches, no numbness, no seizures, no syncope, no weakness, no tremors.  Prior history of stroke Hematologic: No lymphadenopathy, no easy bruising. Psychiatric: No confusion, no hallucinations, no sleep disturbance.    Physical Exam: Filed Vitals:   10/09/14 1123  BP: 116/84  Pulse: 63  The patient appears to be in no distress.  Head and neck exam reveals that the  pupils are equal and reactive.  The extraocular movements are full.  There is no scleral icterus.  Mouth and pharynx are benign.  No lymphadenopathy.  No carotid bruits.  The jugular venous pressure is normal.  Thyroid is not enlarged or tender.  Chest is clear to percussion and auscultation.  No rales or rhonchi.  Expansion of the chest is symmetrical.  Heart reveals no abnormal lift or heave.  First and second heart sounds are normal.  There is a grade 2/6 apical systolic murmur.  The abdomen is soft and nontender.  Bowel sounds are normoactive.  There is no hepatosplenomegaly or mass.  There are no abdominal bruits.  There are old abdominal scar secondary to previously reversed colostomy.  Extremities reveal no phlebitis or edema.  Pedal pulses are good.  There is no cyanosis or clubbing.  Neurologic exam is normal strength and no lateralizing weakness.  No sensory deficits.  Integument reveals no rash  EKG shows normal sinus rhythm with inferior wall T-wave inversion increased since 06/18/14  Assessment / Plan: 1. ischemic cardiomyopathy with ejection fraction 20-25% by echo and Myoview.  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  Thus he has 2 totally occluded arteries. 2. 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 severe mitral regurgitation by echo 4. 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 exertional dyspnea secondary to all of the above 6. history of remote stroke 2010  Disposition: We reviewed the results of his October cardiac catheterization.  The patient has not been experiencing any chest pain.  He still has exertional dyspnea.  Physical examination still reveals a prominent murmur of mitral regurgitation today although apparently this was not noted to be severe at the  time of catheter. We spoke about the possibility of interventions on his 2 chronic total occlusion arteries.  We would need to demonstrate viability in the distal distribution of the right coronary artery first.  We will arrange for the patient to have a cardiac MRI viability study to determine if he would be a candidate for chronic total occlusion PCI of his right coronary artery and possibly also of his circumflex. The patient will continue current medication.  He is making progress in cutting down on his smoking.  Down to a half a packet present.  I've encouraged him to quit all the way. Recheck in 2 months for office visit and EKG.

## 2014-10-11 ENCOUNTER — Encounter: Payer: Self-pay | Admitting: Cardiology

## 2014-10-31 ENCOUNTER — Other Ambulatory Visit (HOSPITAL_COMMUNITY): Payer: BC Managed Care – PPO

## 2014-11-01 ENCOUNTER — Ambulatory Visit (HOSPITAL_COMMUNITY)
Admission: RE | Admit: 2014-11-01 | Discharge: 2014-11-01 | Disposition: A | Discharge status: Home / Self Care | Payer: BLUE CROSS/BLUE SHIELD | Source: Ambulatory Visit | Attending: Cardiology | Admitting: Cardiology

## 2014-11-01 DIAGNOSIS — I083 Combined rheumatic disorders of mitral, aortic and tricuspid valves: Secondary | ICD-10-CM | POA: Insufficient documentation

## 2014-11-01 DIAGNOSIS — I240 Acute coronary thrombosis not resulting in myocardial infarction: Secondary | ICD-10-CM | POA: Diagnosis present

## 2014-11-01 DIAGNOSIS — R931 Abnormal findings on diagnostic imaging of heart and coronary circulation: Secondary | ICD-10-CM | POA: Diagnosis not present

## 2014-11-01 DIAGNOSIS — I255 Ischemic cardiomyopathy: Secondary | ICD-10-CM | POA: Diagnosis not present

## 2014-11-01 DIAGNOSIS — I2582 Chronic total occlusion of coronary artery: Secondary | ICD-10-CM

## 2014-11-01 MED ORDER — GADOBENATE DIMEGLUMINE 529 MG/ML IV SOLN
25.0000 mL | Freq: Once | INTRAVENOUS | Status: AC | PRN
  Administered 2014-11-01: 25 mL via INTRAVENOUS

## 2014-11-22 ENCOUNTER — Ambulatory Visit (INDEPENDENT_AMBULATORY_CARE_PROVIDER_SITE_OTHER): Payer: BLUE CROSS/BLUE SHIELD | Admitting: Cardiology

## 2014-11-22 ENCOUNTER — Encounter: Payer: Self-pay | Admitting: Cardiology

## 2014-11-22 VITALS — BP 112/78 | HR 89 | Ht 70.0 in | Wt 166.7 lb

## 2014-11-22 DIAGNOSIS — I255 Ischemic cardiomyopathy: Secondary | ICD-10-CM

## 2014-11-22 DIAGNOSIS — I340 Nonrheumatic mitral (valve) insufficiency: Secondary | ICD-10-CM

## 2014-11-22 DIAGNOSIS — R5383 Other fatigue: Secondary | ICD-10-CM

## 2014-11-22 DIAGNOSIS — R0789 Other chest pain: Secondary | ICD-10-CM

## 2014-11-22 DIAGNOSIS — R5381 Other malaise: Secondary | ICD-10-CM | POA: Insufficient documentation

## 2014-11-22 LAB — CBC WITH DIFFERENTIAL/PLATELET
Basophils Absolute: 0 10*3/uL (ref 0.0–0.1)
Basophils Relative: 0.6 % (ref 0.0–3.0)
Eosinophils Absolute: 0.3 10*3/uL (ref 0.0–0.7)
Eosinophils Relative: 4.5 % (ref 0.0–5.0)
HCT: 39.4 % (ref 39.0–52.0)
HEMOGLOBIN: 13.5 g/dL (ref 13.0–17.0)
Lymphocytes Relative: 33.6 % (ref 12.0–46.0)
Lymphs Abs: 2.3 10*3/uL (ref 0.7–4.0)
MCHC: 34.3 g/dL (ref 30.0–36.0)
MCV: 101.3 fl — ABNORMAL HIGH (ref 78.0–100.0)
Monocytes Absolute: 0.6 10*3/uL (ref 0.1–1.0)
Monocytes Relative: 8.4 % (ref 3.0–12.0)
Neutro Abs: 3.6 10*3/uL (ref 1.4–7.7)
Neutrophils Relative %: 52.9 % (ref 43.0–77.0)
PLATELETS: 243 10*3/uL (ref 150.0–400.0)
RBC: 3.89 Mil/uL — ABNORMAL LOW (ref 4.22–5.81)
RDW: 14 % (ref 11.5–15.5)
WBC: 6.8 10*3/uL (ref 4.0–10.5)

## 2014-11-22 LAB — TSH: TSH: 1.33 u[IU]/mL (ref 0.35–4.50)

## 2014-11-22 LAB — BASIC METABOLIC PANEL
BUN: 13 mg/dL (ref 6–23)
CO2: 26 mEq/L (ref 19–32)
Calcium: 9.5 mg/dL (ref 8.4–10.5)
Chloride: 103 mEq/L (ref 96–112)
Creatinine, Ser: 0.85 mg/dL (ref 0.40–1.50)
GFR: 98.21 mL/min (ref >60.00)
Glucose, Bld: 89 mg/dL (ref 70–99)
POTASSIUM: 4.8 meq/L (ref 3.5–5.1)
Sodium: 138 mEq/L (ref 135–145)

## 2014-11-22 NOTE — Patient Instructions (Signed)
Will obtain labs today and call you with the results (bmet/tsh/cbc)  Your physician recommends that you continue on your current medications as directed. Please refer to the Current Medication list given to you today.  Your physician recommends that you schedule a follow-up appointment in: 2 month ov/ekg

## 2014-11-22 NOTE — Progress Notes (Signed)
Cardiology Office Note   Date:  11/22/2014   ID:  Jason Parsons, DOB 15-Nov-1955, MRN [id number]  PCP:  Jason Forts, MD  Cardiologist:   Jason Coco, MD   No chief complaint on file.     History of Present Illness: Jason Parsons is a 59 y.o. male who presents for follow-up office visit   This pleasant 59 year old gentleman is seen back for a followup office visit following his recent cardiac catheterization. On 08/03/2014 the patient had a Myoview stress test showing a large inferolateral scar with no reversible ischemia and with an ejection fraction of 20%. The left ventricle was dilated. On 08/02/14 the patient had an echocardiogram which showed an ejection fraction of 20-25% with wall motion abnormalities in the inferior and lateral wall and there was grade 3 diastolic dysfunction. There was severe mitral regurgitation and his pulmonary artery pressure was 66 mmHg.  We had initially seen him in consultation for Dr. Windell Moment. History is as follows:This 59 year old gentleman is seen at the request of Jason Parsons M.D. of urgent care on [address]. he is being seen because shortness of breath. He denies any orthopnea or paroxysmal nocturnal dyspnea. No peripheral edema. The patient has not noticed any significant increase in urine output since we put him on low-dose Lasix over the past weekend. The patient has a history of emphysema. He has been a heavy smoker. He is trying to quit. He smokes one pack of cigarettes a day. His last chest x-ray in 2013 showed emphysema. The patient is not diabetic. He has mild hyper lipidemia with LDL cholesterol of 113 in January 2015.  He was seen at urgent care in August 2015 with chest pressure or and questionable arm radiation. He had some nonspecific T wave changes on his EKG in the inferior leads and he was advised to go to the hospital. However, he refused hospitalization. He has had no further episodes of chest discomfort. He continues  to have exertional dyspnea walking up stairs and walking uphill.  The patient underwent cardiac catheterization by Dr. 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  He had a subsequent cardiac MRI for viability which showed no viability of the distribution of the right coronary artery or circumflex. He has a past history of having had a stroke in 2010 and was hospitalized in [place]. He was on blood thinner for a while and then switched to aspirin.  The patient has a history of having had a heart murmur since childhood.  Family history reveals that his mother is living at age 25 and has arthritis but no heart problems.  The patient's father died at age 86 of complications of rheumatic fever, possibly endocarditis.  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  Past Medical History  Diagnosis Date  . Diverticulitis     with perforation; s/p colostomy with reversal.  . Abdominal aortic aneurysm 06/26/2012    3 cm distal  . Insomnia   . Genital herpes   . CVA (cerebral infarction) 10/26/2013    near syncope, blurred vision; L sided weakness and numbness.    . Seizures   . Depression   . Emphysema of lung     Past Surgical History  Procedure Laterality Date  . Bunionectomy    . Plantar fibroma    . Admission  L knee septic joint MRSA after steroid injection L knee.   PICC line iv abx.  . Joint replacement      L TKR. Alusio.  . Colostomy  08/26/2009    diverticulitis with perforation.  . Colostomy reversal  03/26/2010  . Small intestine surgery    . Left and right heart catheterization with coronary angiogram N/A 08/08/2014    Procedure: LEFT AND RIGHT HEART CATHETERIZATION WITH CORONARY ANGIOGRAM;  Surgeon: Jettie Booze, MD;  Location: [facility] CATH LAB;  Service: Cardiovascular;  Laterality: N/A;     Current  Outpatient Prescriptions  Medication Sig Dispense Refill  . albuterol (PROVENTIL HFA;VENTOLIN HFA) 108 (90 BASE) MCG/ACT inhaler Inhale 2 puffs into the lungs every 6 (six) hours as needed for wheezing or shortness of breath. 1 Inhaler 2  . aspirin EC 325 MG EC tablet Take 1 tablet (325 mg total) by mouth daily. 30 tablet 0  . atorvastatin (LIPITOR) 10 MG tablet Take 1 tablet (10 mg total) by mouth daily at 6 PM. 90 tablet 1  . carvedilol (COREG) 3.125 MG tablet Take 1 tablet (3.125 mg total) by mouth 2 (two) times daily. 60 tablet 5  . cycloSPORINE (RESTASIS) 0.05 % ophthalmic emulsion Place 1 drop into both eyes daily.    . furosemide (LASIX) 20 MG tablet Take 1 tablet (20 mg total) by mouth daily. 30 tablet 2  . nitroGLYCERIN (NITROSTAT) 0.4 MG SL tablet Place 1 tablet (0.4 mg total) under the tongue every 5 (five) minutes as needed for chest pain. 25 tablet prn  . tiotropium (SPIRIVA) 18 MCG inhalation capsule Place 1 capsule (18 mcg total) into inhaler and inhale daily. 30 capsule 12  . traZODone (DESYREL) 50 MG tablet Take 100 mg by mouth at bedtime.    . varenicline (CHANTIX CONTINUING MONTH PAK) 1 MG tablet Take 1 tablet (1 mg total) by mouth 2 (two) times daily. 60 tablet 2   No current facility-administered medications for this visit.    Allergies:   Review of patient's allergies indicates no known allergies.    Social History:  The patient  reports that he has been smoking Cigarettes.  He has smoked for the past 35 years. He has never used smokeless tobacco. He reports that he drinks alcohol. He reports that he does not use illicit drugs.   Family History:  The patient's family history includes Arthritis in his mother. no family history of known heart trouble   ROS:  Please see the history of present illness.   Otherwise, review of systems are positive for fatigue and lack of energy.   All other systems are reviewed and negative.    PHYSICAL EXAM: VS:  BP 112/78 mmHg  Pulse 89   Ht 5\' 10"  (1.778 m)  Wt 166 lb 10.6 oz (75.596 kg)  BMI 23.91 kg/m2 , BMI Body mass index is 23.91 kg/(m^2). GEN: Well nourished, well developed, in no acute distress HEENT: normal Neck: no JVD, carotid bruits, or masses Cardiac: RRR; grade 3/6 murmur of mitral regurgitation and apex. No rubs, or gallops,no edema  Respiratory:  clear to auscultation bilaterally, normal work of breathing GI: soft, nontender, nondistended, + BS MS: no deformity or atrophy Skin: warm and dry, no rash Neuro:  Strength and sensation are intact Psych: euthymic mood, full affect   EKG:  EKG is ordered today. The ekg ordered today demonstrates junctional rhythm.  P waves not seen.  T waves are more peaked rule out hyperkalemia.   Recent Labs:  06/18/2014: TSH 1.663 09/03/2014: ALT 17; Hemoglobin 16.3; Platelets 210 10/09/2014: BUN 14; Creatinine 0.9; Potassium 4.2; Sodium 134*    Lipid Panel    Component Value Date/Time   CHOL 180 09/03/2014 1334   TRIG 84 09/03/2014 1334   HDL 51 09/03/2014 1334   CHOLHDL 3.5 09/03/2014 1334   VLDL 17 09/03/2014 1334   LDLCALC 112* 09/03/2014 1334      Wt Readings from Last 3 Encounters:  11/22/14 166 lb 10.6 oz (75.596 kg)  10/09/14 158 lb (71.668 kg)  09/03/14 153 lb 12.8 oz (69.763 kg)      Other studies Reviewed: Additional studies/ records that were reviewed today include:  Review of the above records demonstrates:    ASSESSMENT AND PLAN:  1. ischemic cardiomyopathy with ejection fraction 20-25% by echo and Myoview. 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 Thus he has 2 totally occluded arteries. 2. Myoview suggestive of old large inferolateral myocardial infarction without reversible ischemia.  Cardiac MRI confirms no viability of the distribution of the right coronary artery or left circumflex. 3.  severe mitral regurgitation by echo 4. 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 exertional dyspnea secondary to all of the above 6. history of remote stroke 2010 7.  EKG today shows peaked T waves in the anterior leads rule out hyperkalemia.  The patient states that he does not eat excessive fruit.  He states that he drinks plenty of water each day and has no difficulty voiding.  Previous evaluation of his renal function has been normal.  We will check a basal metabolic panel today. 8.  Ischemic cardiomyopathy with low ejection fraction.  I discussed the possibility of a implantable defibrillator for the patient.  He is not interested in pursuing that at this time because he feels so tired all the time and states that his quality of life is poor.  We will check lab work to look for other causes of fatigue such as low thyroid or anemia.  We can talk further about defibrillator in the future his quality of life improves.  We will consider adding an ARB his potassium level is satisfactory Recheck in 2 months for follow-up office visit and EKG   Current medicines are reviewed at length with the patient today.  The patient does not have concerns regarding medicines.  The following changes have been made:  no change  Labs/ tests ordered today include: CBC, TSH, and basal metabolic panel  No orders of the defined types were placed in this encounter.     Disposition:   FU with Dr. Mare Ferrari in 2 months for office visit and EKG   Signed, Jason Coco, MD  11/22/2014 8:26 AM    [address], [place], [place]  [address] Phone: [phone]; Fax: [phone]

## 2014-11-23 ENCOUNTER — Telehealth: Payer: Self-pay | Admitting: *Deleted

## 2014-11-23 DIAGNOSIS — Z79899 Other long term (current) drug therapy: Secondary | ICD-10-CM

## 2014-11-23 MED ORDER — LISINOPRIL 2.5 MG PO TABS: 2.5000 mg | ORAL_TABLET | Freq: Every day | ORAL | Status: DC

## 2014-11-23 NOTE — Telephone Encounter (Signed)
Advised patient of lab results and medication being added Scheduled follow up labs

## 2014-11-23 NOTE — Telephone Encounter (Signed)
-----   Message from Darlin Coco, MD sent at 11/22/2014  5:08 PM EST ----- Please report.  The kidney function is normal.  The thyroid is normal.  He is not anemic.  To help improve his left ventricular function I want him to add lisinopril 2.5 mg 1 daily.  Recheck a basal metabolic panel in about a week.

## 2014-11-27 ENCOUNTER — Ambulatory Visit: Payer: BC Managed Care – PPO | Admitting: Cardiology

## 2014-12-04 ENCOUNTER — Other Ambulatory Visit: Payer: BLUE CROSS/BLUE SHIELD

## 2014-12-05 ENCOUNTER — Ambulatory Visit (INDEPENDENT_AMBULATORY_CARE_PROVIDER_SITE_OTHER): Payer: BLUE CROSS/BLUE SHIELD | Admitting: Emergency Medicine

## 2014-12-05 VITALS — BP 100/64 | HR 58 | Temp 97.7°F | Resp 16 | Ht 70.0 in | Wt 162.0 lb

## 2014-12-05 DIAGNOSIS — R059 Cough, unspecified: Secondary | ICD-10-CM

## 2014-12-05 DIAGNOSIS — R0981 Nasal congestion: Secondary | ICD-10-CM

## 2014-12-05 DIAGNOSIS — R5383 Other fatigue: Secondary | ICD-10-CM

## 2014-12-05 DIAGNOSIS — R05 Cough: Secondary | ICD-10-CM

## 2014-12-05 LAB — POCT INFLUENZA A/B
INFLUENZA A, POC: NEGATIVE
INFLUENZA B, POC: NEGATIVE

## 2014-12-05 MED ORDER — DOXYCYCLINE HYCLATE 100 MG PO TABS: 100.0000 mg | ORAL_TABLET | Freq: Two times a day (BID) | ORAL | Status: DC

## 2014-12-05 MED ORDER — HYDROCODONE-HOMATROPINE 5-1.5 MG/5ML PO SYRP: 5.0000 mL | ORAL_SOLUTION | Freq: Three times a day (TID) | ORAL | Status: DC | PRN

## 2014-12-05 NOTE — Patient Instructions (Signed)

## 2014-12-05 NOTE — Progress Notes (Addendum)
   Subjective:    Patient ID: Jason Parsons, male    DOB: 1955-11-17, 59 y.o.   MRN: [id number] This chart was scribed for Arlyss Queen, MD by Marti Sleigh, Medical Scribe. This patient was seen in Room 1 and the patient's care was started at 11:53 AM.  Chief Complaint  Patient presents with  . Cough    X2days  . Laryngitis  . Fatigue    HPI HPI Comments: Jason Parsons is a 59 y.o. male with a hx of CVA, ischemic cardiomyopathy, and chronic obstructive lung disease who presents to [facility] complaining of cough productive of green sputum and rhinorrhea for the last three days. Pt endorses associated voice disturbance, postnasal drip and fatigue. Pt denies sore throat. Pt states he went to [facility] last Thursday, and was in multiple airports so he was exposed to many people. Pt has NKDA. Pt has not taken any medications PTA. Pt smokes 1/2 pack per day.     Review of Systems  Constitutional: Positive for fatigue. Negative for fever and chills.  HENT: Positive for congestion, postnasal drip and rhinorrhea.   Respiratory: Positive for cough.   Musculoskeletal: Negative for arthralgias.       Objective:   Physical Exam  Constitutional: He is oriented to person, place, and time. He appears well-developed and well-nourished.  Nontoxic.  HENT:  Head: Normocephalic and atraumatic.  Mild rhinorrhea.  Eyes: Pupils are equal, round, and reactive to light.  Neck: No JVD present.  Cardiovascular: Normal rate and regular rhythm.   Pulmonary/Chest: Effort normal and breath sounds normal. No respiratory distress.  occasional rhonchi.  Neurological: He is alert and oriented to person, place, and time.  Skin: Skin is warm and dry.  Psychiatric: He has a normal mood and affect. His behavior is normal.  Nursing note and vitals reviewed.  Results for orders placed or performed in visit on 12/05/14  POCT Influenza A/B  Result Value Ref Range   Influenza A, POC Negative    Influenza B, POC Negative         Assessment & Plan:  We'll treat with doxycycline due to his history of COPD. Flu test was negative. His pulse ox 97%. He was given some Hycodan to have at night for cough. He can use his albuterol inhaler as needed. I personally performed the services described in this documentation, which was scribed in my presence. The recorded information has been reviewed and is accurate.

## 2014-12-06 ENCOUNTER — Ambulatory Visit: Payer: BLUE CROSS/BLUE SHIELD | Admitting: Cardiology

## 2014-12-10 ENCOUNTER — Ambulatory Visit: Payer: BC Managed Care – PPO | Admitting: Family Medicine

## 2015-01-11 ENCOUNTER — Other Ambulatory Visit: Payer: Self-pay | Admitting: Family Medicine

## 2015-01-31 ENCOUNTER — Encounter: Payer: Self-pay | Admitting: Cardiology

## 2015-01-31 ENCOUNTER — Ambulatory Visit (INDEPENDENT_AMBULATORY_CARE_PROVIDER_SITE_OTHER): Payer: BLUE CROSS/BLUE SHIELD | Admitting: Cardiology

## 2015-01-31 VITALS — BP 114/80 | HR 61 | Ht 70.0 in | Wt 159.8 lb

## 2015-01-31 DIAGNOSIS — R0609 Other forms of dyspnea: Secondary | ICD-10-CM | POA: Diagnosis not present

## 2015-01-31 DIAGNOSIS — Z79899 Other long term (current) drug therapy: Secondary | ICD-10-CM | POA: Diagnosis not present

## 2015-01-31 DIAGNOSIS — I255 Ischemic cardiomyopathy: Secondary | ICD-10-CM | POA: Diagnosis not present

## 2015-01-31 DIAGNOSIS — I340 Nonrheumatic mitral (valve) insufficiency: Secondary | ICD-10-CM

## 2015-01-31 NOTE — Patient Instructions (Signed)
Medication Instructions:  Your physician recommends that you continue on your current medications as directed. Please refer to the Current Medication list given to you today.  Labwork: NONE  Testing/Procedures: NONE  Follow-Up: 4 MONTH OV/EKG/BMET

## 2015-01-31 NOTE — Progress Notes (Signed)
Cardiology Office Note   Date:  01/31/2015   ID:  Jason Parsons, DOB 03-22-1956, MRN [id number]  PCP:  Reginia Forts, MD  Cardiologist:   Darlin Coco, MD   Chief Complaint  Patient presents with  . Follow-up    6 month      History of Present Illness: Jason Parsons is a 59 y.o. male who presents for follow-up office visit  This pleasant 59 year old gentleman is seen back for a followup office visit following his recent cardiac catheterization. On 08/03/2014 the patient had a Myoview stress test showing a large inferolateral scar with no reversible ischemia and with an ejection fraction of 20%. The left ventricle was dilated. On 08/02/14 the patient had an echocardiogram which showed an ejection fraction of 20-25% with wall motion abnormalities in the inferior and lateral wall and there was grade 3 diastolic dysfunction. There was severe mitral regurgitation and his pulmonary artery pressure was 66 mmHg.  We had initially seen him in consultation for Dr. Windell Moment. History is as follows:This 59 year old gentleman is seen at the request of Reginia Forts M.D. of urgent care on [address]. he is being seen because shortness of breath. He denies any orthopnea or paroxysmal nocturnal dyspnea. No peripheral edema. The patient has not noticed any significant increase in urine output since we put him on low-dose Lasix over the past weekend. The patient has a history of emphysema. He has been a heavy smoker. He is trying to quit. He smokes one pack of cigarettes a day. His last chest x-ray in 2013 showed emphysema. The patient is not diabetic. He has mild hyper lipidemia with LDL cholesterol of 113 in January 2015.  He was seen at urgent care in August 2015 with chest pressure or and questionable arm radiation. He had some nonspecific T wave changes on his EKG in the inferior leads and he was advised to go to the hospital. However, he refused hospitalization. He has had no further  episodes of chest discomfort. He continues to have exertional dyspnea walking up stairs and walking uphill.  The patient underwent cardiac catheterization by Dr. 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 He had a subsequent cardiac MRI for viability which showed no viability of the distribution of the right coronary artery or circumflex. He has a past history of having had a stroke in 2010 and was hospitalized in [place]. He was on blood thinner for a while and then switched to aspirin.  The patient has a history of having had a heart murmur since childhood.  Family history reveals that his mother is living at age 5 and has arthritis but no heart problems.  The patient's father died at age 76 of complications of rheumatic fever, possibly endocarditis.  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 Since last visit his weight is down 3 pounds.  He is not having any chest pain.  He has not had to take any sublingual nitroglycerin.  He is not having any peripheral edema.  He is still trying to quit smoking.  He found that Chantix did not help him.  Past Medical History  Diagnosis Date  . Diverticulitis     with perforation; s/p colostomy with reversal.  . Abdominal aortic aneurysm 06/26/2012    3 cm distal  . Insomnia   . Genital  herpes   . CVA (cerebral infarction) 10/26/2013    near syncope, blurred vision; L sided weakness and numbness.    . Seizures   . Depression   . Emphysema of lung     Past Surgical History  Procedure Laterality Date  . Bunionectomy    . Plantar fibroma    . Admission      L knee septic joint MRSA after steroid injection L knee.   PICC line iv abx.  . Joint replacement      L TKR. Alusio.  . Colostomy  08/26/2009    diverticulitis with perforation.  . Colostomy reversal  03/26/2010  . Small  intestine surgery    . Left and right heart catheterization with coronary angiogram N/A 08/08/2014    Procedure: LEFT AND RIGHT HEART CATHETERIZATION WITH CORONARY ANGIOGRAM;  Surgeon: Jettie Booze, MD;  Location: [facility] CATH LAB;  Service: Cardiovascular;  Laterality: N/A;     Current Outpatient Prescriptions  Medication Sig Dispense Refill  . albuterol (PROVENTIL HFA;VENTOLIN HFA) 108 (90 BASE) MCG/ACT inhaler Inhale 2 puffs into the lungs every 6 (six) hours as needed for wheezing or shortness of breath. 1 Inhaler 2  . aspirin EC 325 MG EC tablet Take 1 tablet (325 mg total) by mouth daily. 30 tablet 0  . atorvastatin (LIPITOR) 10 MG tablet Take 1 tablet (10 mg total) by mouth daily at 6 PM. 90 tablet 1  . carvedilol (COREG) 3.125 MG tablet Take 1 tablet (3.125 mg total) by mouth 2 (two) times daily. 60 tablet 5  . nitroGLYCERIN (NITROSTAT) 0.4 MG SL tablet Place 1 tablet (0.4 mg total) under the tongue every 5 (five) minutes as needed for chest pain. 25 tablet prn  . SPIRIVA HANDIHALER 18 MCG inhalation capsule INHALE 1 CAPSULE BY HANDYHALER BY MOUTH EVERY DAY 30 capsule 2  . Sulfacetamide Sodium-Sulfur 10-5 % LOTN Apply 1 application topically daily.  5  . traZODone (DESYREL) 50 MG tablet Take 100 mg by mouth at bedtime.    . traZODone (DESYREL) 50 MG tablet TAKE 1 TO 2 TABLETS BY MOUTH EVERY NIGHT AT BEDTIME AS NEEDED FOR SLEEP 60 tablet 2   No current facility-administered medications for this visit.    Allergies:   Review of patient's allergies indicates no known allergies.    Social History:  The patient  reports that he has been smoking Cigarettes.  He has smoked for the past 35 years. He has never used smokeless tobacco. He reports that he drinks alcohol. He reports that he does not use illicit drugs.   Family History:  The patient's  family history includes Arthritis in his mother.    ROS:  Please see the history of present illness.   Otherwise, review of systems are  positive for none.   All other systems are reviewed and negative.    PHYSICAL EXAM: VS:  BP 114/80 mmHg  Pulse 61  Ht 5\' 10"  (1.778 m)  Wt 159 lb 12.8 oz (72.485 kg)  BMI 22.93 kg/m2  SpO2 98% , BMI Body mass index is 22.93 kg/(m^2). GEN: Well nourished, well developed, in no acute distress HEENT: normal Neck: no JVD, carotid bruits, or masses Cardiac: RRR; no murmurs, rubs, or gallops,no edema  Respiratory:  clear to auscultation bilaterally, normal work of breathing GI: soft, nontender, nondistended, + BS MS: no deformity or atrophy Skin: warm and dry, no rash Neuro:  Strength and sensation are intact Psych: euthymic mood, full affect   EKG:  EKG  is ordered today. The ekg ordered today demonstrates normal sinus rhythm.  Since previous tracing of 11/22/14, heart rate is slower and P waves are visible.  The peaked T waves are unchanged.   Recent Labs: 09/03/2014: ALT 17 11/22/2014: BUN 13; Creatinine 0.85; Hemoglobin 13.5; Platelets 243.0; Potassium 4.8; Sodium 138; TSH 1.33    Lipid Panel    Component Value Date/Time   CHOL 180 09/03/2014 1334   TRIG 84 09/03/2014 1334   HDL 51 09/03/2014 1334   CHOLHDL 3.5 09/03/2014 1334   VLDL 17 09/03/2014 1334   LDLCALC 112* 09/03/2014 1334      Wt Readings from Last 3 Encounters:  01/31/15 159 lb 12.8 oz (72.485 kg)  12/05/14 162 lb (73.483 kg)  11/22/14 166 lb 10.6 oz (75.596 kg)        ASSESSMENT AND PLAN:  1. ischemic cardiomyopathy with ejection fraction 20-25% by echo and Myoview. 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 Thus he has 2 totally occluded arteries. 2. Myoview suggestive of old large inferolateral myocardial infarction without reversible ischemia. Cardiac MRI confirms no viability of the distribution of the right coronary artery or left circumflex. 3. severe  mitral regurgitation by echo 4. 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 exertional dyspnea secondary to all of the above 6. history of remote stroke 2010 7. EKG today shows peaked T waves in the anterior leads .  No change from prior EKG except heart rate is slower.  His serum potassium level has been in the high normal range. 8. Ischemic cardiomyopathy with low ejection fraction.  He is on carvedilol.  Not on an ARB because of borderline high potassium. We discussed again today whether he would like to pursue an implant of an ICD for primary prevention of sudden cardiac death.  He would not like to pursue that at this time. Recheck in 4 months months for follow-up office visit and EKG and basal metabolic panel.   Current medicines are reviewed at length with the patient today. The patient does not have concerns regarding medicines.    The following changes have been made:  no change  Labs/ tests ordered today include: EKG   Orders Placed This Encounter  Procedures  . Basic metabolic panel  . EKG 12-Lead      Signed, Darlin Coco, MD  01/31/2015 7:05 PM    [facility] [place], [place], [place]  [address] Phone: [phone]; Fax: [phone]

## 2015-02-15 ENCOUNTER — Other Ambulatory Visit: Payer: Self-pay | Admitting: Cardiology

## 2015-03-20 ENCOUNTER — Other Ambulatory Visit: Payer: Self-pay | Admitting: Family Medicine

## 2015-04-19 ENCOUNTER — Other Ambulatory Visit: Payer: Self-pay | Admitting: Family Medicine

## 2015-05-06 ENCOUNTER — Telehealth: Payer: Self-pay

## 2015-05-06 ENCOUNTER — Telehealth: Payer: Self-pay | Admitting: Cardiology

## 2015-05-06 NOTE — Telephone Encounter (Signed)
[employer]  Coroner's office [place], [place] calling about pt's last OV w/ Dr. Mare Ferrari. Please call back and discuss.

## 2015-05-06 NOTE — Telephone Encounter (Signed)
A cruz from the coroners office in [place] is asking if dr Everlene Farrier would sign off On this pts death certificate   Best phone for [place] is [phone]

## 2015-05-07 NOTE — Telephone Encounter (Signed)
I called the number below and it is disconnected.

## 2015-05-07 NOTE — Telephone Encounter (Signed)
I think this should be signed off by the cardiologist who cares for him with his heart disease. I do not know him well. I do not think I should sign it off

## 2015-05-09 ENCOUNTER — Telehealth: Payer: Self-pay | Admitting: Cardiology

## 2015-05-09 NOTE — Telephone Encounter (Signed)
Request received on letterhead from [facility] asking for records, I let Lanelle Bal know I will discuss with my manager and get back with her about  The process of releasing these records.

## 2015-05-13 ENCOUNTER — Telehealth: Payer: Self-pay | Admitting: Cardiology

## 2015-05-13 NOTE — Telephone Encounter (Signed)
Discussed releasing records of patient with Art Buff she stated ok, I called verified fax and who I will be releasing to. This is under release function.

## 2015-05-27 DEATH — deceased
